# Patient Record
Sex: Female | Born: 2000 | Race: Black or African American | Hispanic: No | Marital: Single | Smoking: Never smoker
Health system: Southern US, Community
[De-identification: ages and names within clinical notes are randomized; demographics above are authoritative.]

## PROBLEM LIST (undated history)

## (undated) ENCOUNTER — Ambulatory Visit: Admission: EM | Payer: Self-pay

## (undated) DIAGNOSIS — J45909 Unspecified asthma, uncomplicated: Secondary | ICD-10-CM

---
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
## 2020-06-03 IMAGING — CT CT PELVIS W/O CM
2 of 3 series · 15 of 46 positions shown, 17 images · non-contrast
Comparison: Left hip x-rays from same day. CT abdomen pelvis from
yesterday.

CLINICAL DATA: Left acetabular fracture status post ORIF.

EXAM:
CT PELVIS WITHOUT CONTRAST
TECHNIQUE: Multidetector CT imaging of the pelvis was performed following the
standard protocol without intravenous contrast.

[Series 5: pelvis 2.0 (person_name) (person_name) · axial · 0.82mm/px · z∈[+810,+1032]mm · 12 of 129 slices shown, 14 images]
[im 9/129  soft-tissue]
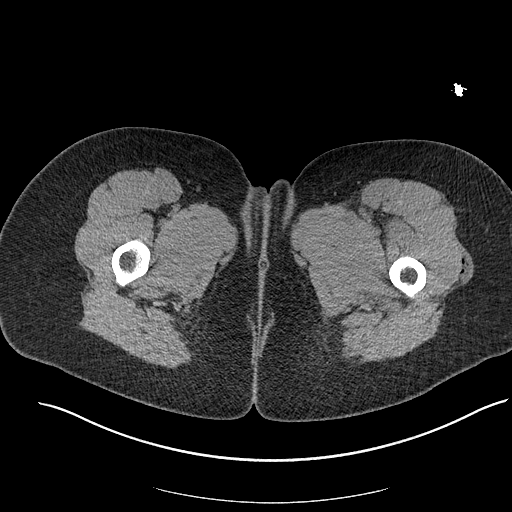
[im 9/129  bone]
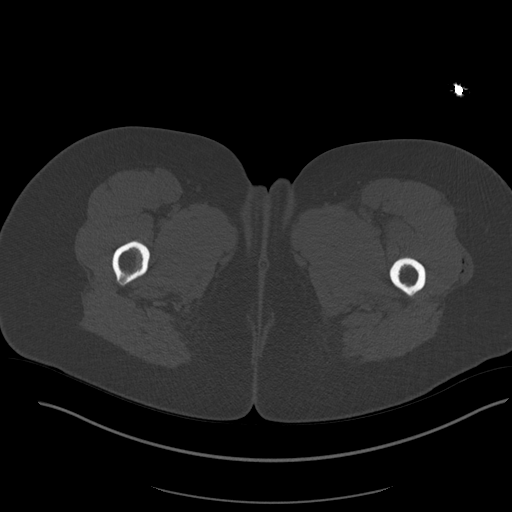
[im 17/129  soft-tissue]
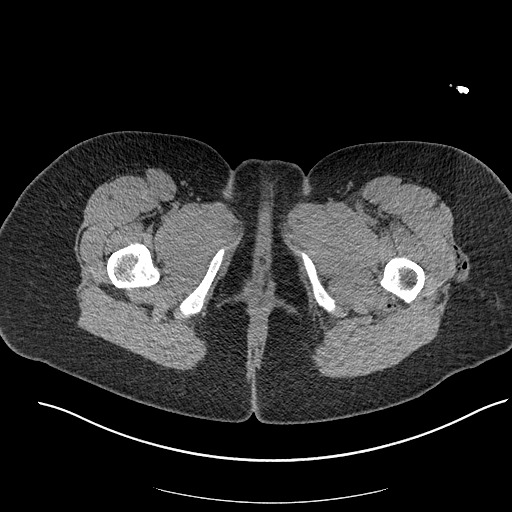
[im 29/129  soft-tissue]
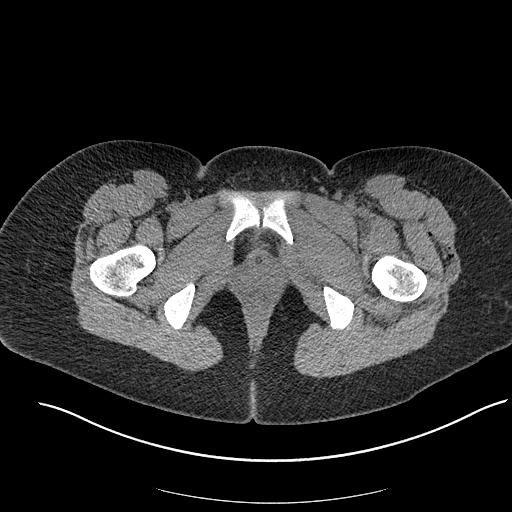
[im 38/129  soft-tissue]
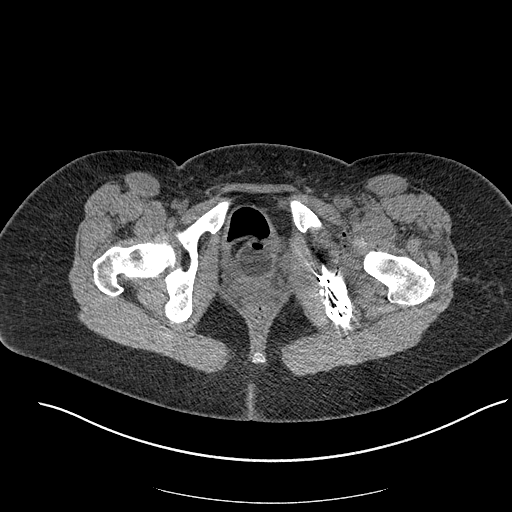
[im 50/129  soft-tissue]
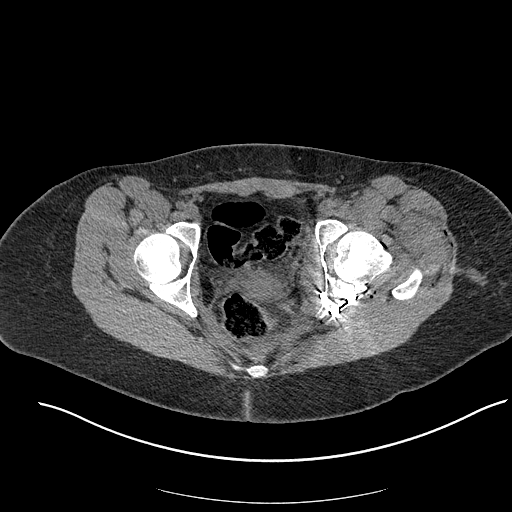
[im 58/129  soft-tissue]
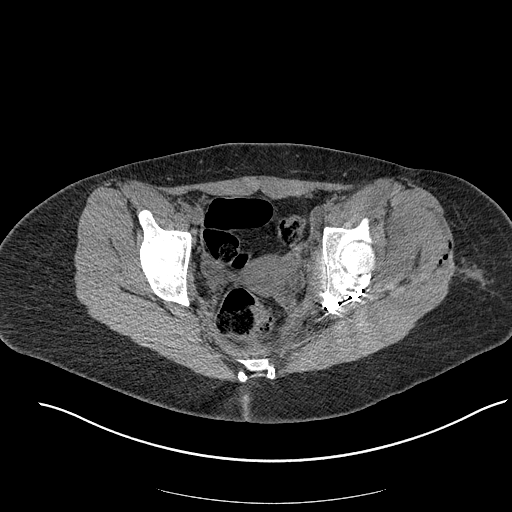
[im 71/129  soft-tissue]
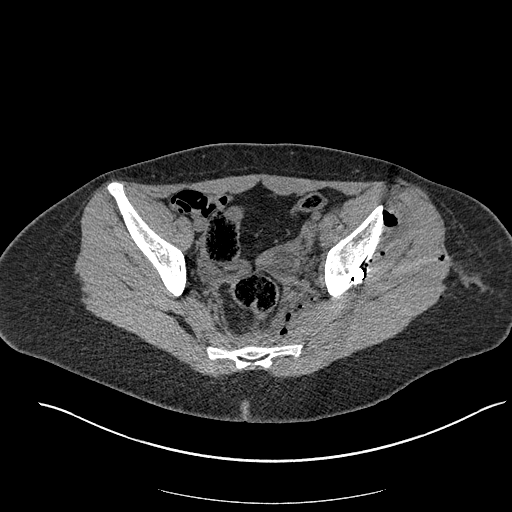
[im 79/129  soft-tissue]
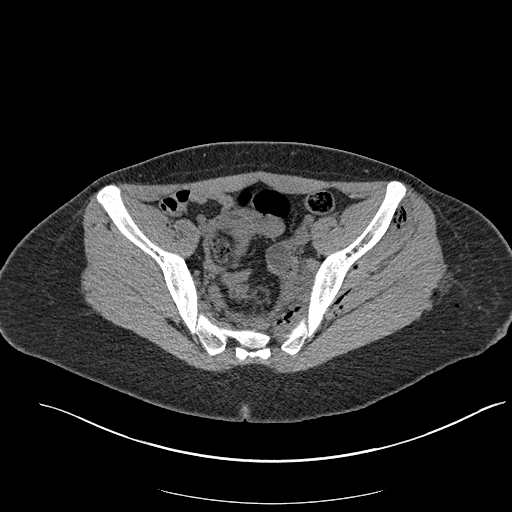
[im 91/129  soft-tissue]
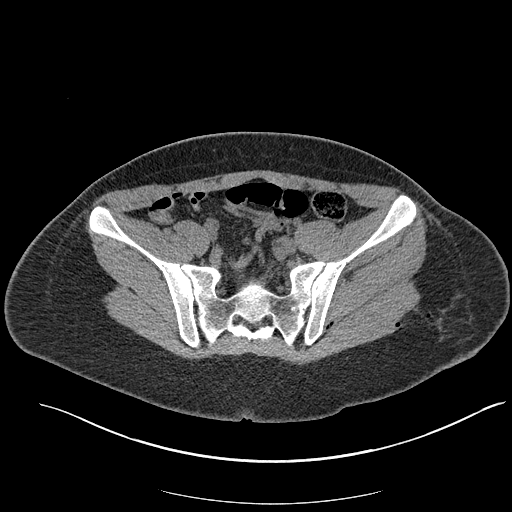
[im 91/129  bone]
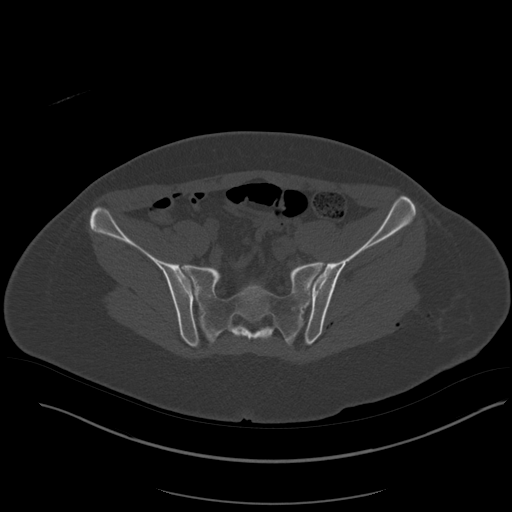
[im 100/129  soft-tissue]
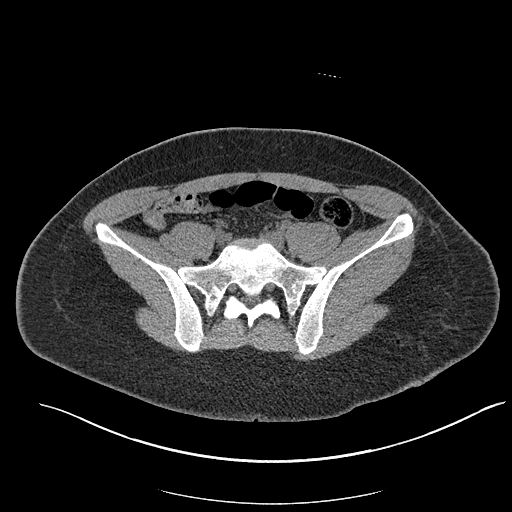
[im 112/129  soft-tissue]
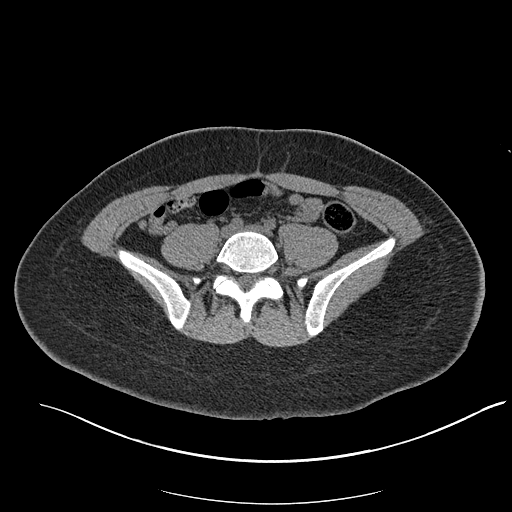
[im 120/129  soft-tissue]
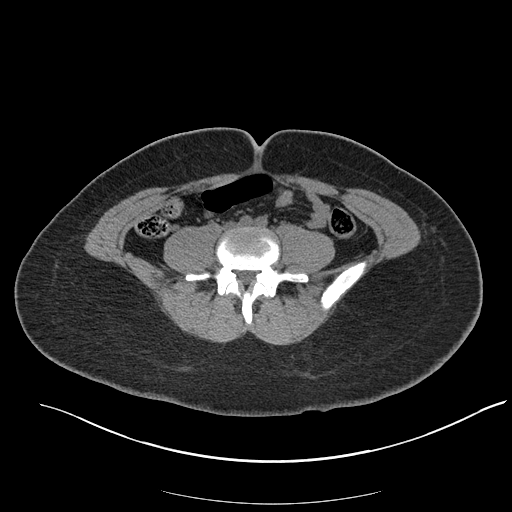

[Series 6: pelvis 2.0 cor. bone · coronal · 0.52mm/px · 3 of 133 slices shown]
[im 45/133  soft-tissue]
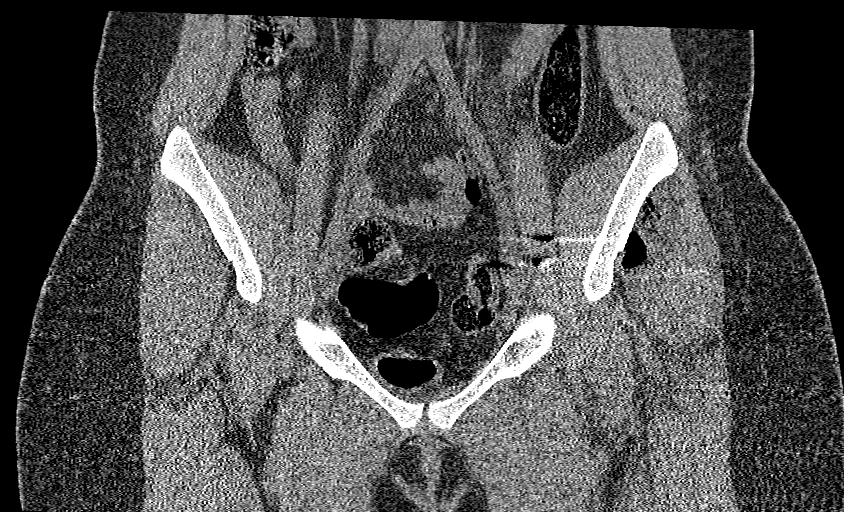
[im 59/133  soft-tissue]
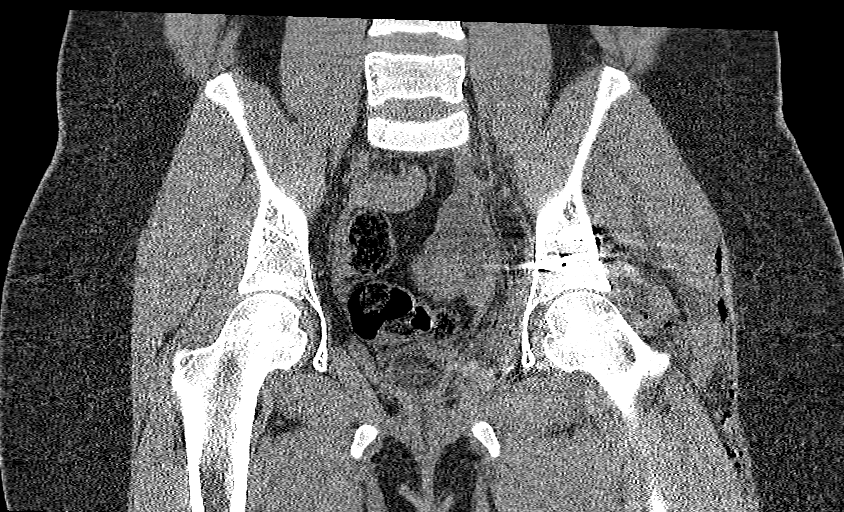
[im 74/133  soft-tissue]
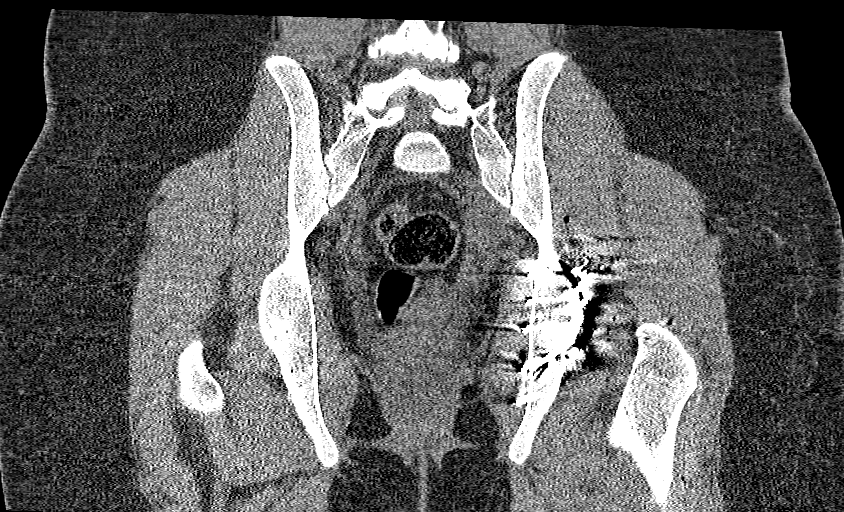

[15 of 46 positions shown; findings below may reference images not displayed]

FINDINGS: Urinary Tract: Foley catheter and air within the decompressed
bladder.

Bowel:  Unremarkable visualized pelvic bowel loops.

Vascular/Lymphatic: No pathologically enlarged lymph nodes. No
significant vascular abnormality seen.

Reproductive: Uterus and bilateral adnexa are unremarkable. Dominant
follicle in the left ovary.

Other:  Small amount of left pelvic sidewall hematoma, unchanged.

Musculoskeletal: Postsurgical changes related to interval left
posterior acetabular fracture ORIF. Alignment is now anatomic. No
significant articular surface incongruity. Nondisplaced longitudinal
fracture through the anterior acetabular column is unchanged.
Scattered foci of postsurgical subcutaneous emphysema about the left
hip. Small intra-articular air within the left hip joint. No focal
fluid collection or hematoma.
IMPRESSION: 1. Interval left posterior acetabular fracture ORIF, now in anatomic
alignment. No significant articular surface incongruity.
2. Unchanged nondisplaced longitudinal fracture through the anterior
acetabular column.
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
## 2021-02-08 ENCOUNTER — Other Ambulatory Visit: Payer: Self-pay

## 2021-02-08 DIAGNOSIS — S61210A Laceration without foreign body of right index finger without damage to nail, initial encounter: Secondary | ICD-10-CM | POA: Insufficient documentation

## 2021-02-08 DIAGNOSIS — Z23 Encounter for immunization: Secondary | ICD-10-CM | POA: Diagnosis not present

## 2021-02-08 DIAGNOSIS — S6991XA Unspecified injury of right wrist, hand and finger(s), initial encounter: Secondary | ICD-10-CM | POA: Diagnosis present

## 2021-02-08 DIAGNOSIS — J45909 Unspecified asthma, uncomplicated: Secondary | ICD-10-CM | POA: Diagnosis not present

## 2021-02-08 DIAGNOSIS — W268XXA Contact with other sharp object(s), not elsewhere classified, initial encounter: Secondary | ICD-10-CM | POA: Diagnosis not present
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
## 2021-02-09 ENCOUNTER — Emergency Department
Admission: EM | Admit: 2021-02-09 | Discharge: 2021-02-09 | Disposition: A | Discharge status: Home / Self Care | Payer: Medicaid Other | Attending: Emergency Medicine | Admitting: Emergency Medicine

## 2021-02-09 DIAGNOSIS — S61210A Laceration without foreign body of right index finger without damage to nail, initial encounter: Secondary | ICD-10-CM

## 2021-02-09 MED ORDER — TETANUS-DIPHTH-ACELL PERTUSSIS 5-2.5-18.5 LF-MCG/0.5 IM SUSY
0.5000 mL | PREFILLED_SYRINGE | Freq: Once | INTRAMUSCULAR | Status: AC
  Administered 2021-02-09: 0.5 mL via INTRAMUSCULAR
  Filled 2021-02-09: qty 0.5

## 2021-02-09 MED ORDER — IBUPROFEN 400 MG PO TABS
600.0000 mg | ORAL_TABLET | Freq: Once | ORAL | Status: AC
Start: 2021-02-09 — End: 2021-02-09
  Administered 2021-02-09: 600 mg via ORAL
  Filled 2021-02-09: qty 2

## 2021-02-09 MED ORDER — LIDOCAINE HCL (PF) 1 % IJ SOLN
5.0000 mL | Freq: Once | INTRAMUSCULAR | Status: DC
  Filled 2021-02-09: qty 5

## 2021-02-09 NOTE — Discharge Instructions (Addendum)
Keep laceration dry and clean. Wash with warm water and soap. Apply topical bacitracin. You received 4 stitches that must be removed in 7 days.  Watch for signs of infection: pus, redness of the skin surrounding it, or fever. If these develop see your doctor or return to the ER for antibiotics.

## 2021-02-09 NOTE — ED Provider Notes (Signed)
[facility] Emergency Department Provider Note  ____________________________________________  Time seen: Approximately 1:45 AM  I have reviewed the triage vital signs and the nursing notes.   HISTORY  Chief Complaint Laceration   HPI Katherine Butler is a 20 y.o. female presents for evaluation of finger laceration.  Patient reports that she cut her finger just prior to arrival on the lid of aluminum can.  Unknown last tetanus shot.  She is complaining of moderate constant throbbing pain since it happened.  No other injuries.  Past Medical History:  Diagnosis Date   Asthma     Patient Active Problem List   Diagnosis Date Noted   Acetabular fracture (HCC) 10/27/2017   Closed left acetabular fracture (HCC) 10/27/2017   Hematuria 10/27/2017    Past Surgical History:  Procedure Laterality Date   ORIF ACETABULAR FRACTURE Left 10/28/2017   Procedure: OPEN REDUCTION INTERNAL FIXATION (ORIF) ACETABULAR FRACTURE;  Surgeon: Roby Lofts, MD;  [facility];  Service: Orthopedics;  Laterality: Left;    Prior to Admission medications   Medication Sig Start Date End Date Taking? Authorizing Provider  enoxaparin (LOVENOX) 40 MG/0.4ML injection Inject 0.4 mLs (40 mg total) into the skin daily. 10/31/17 11/30/17  Swaffar, Otto Herb, MD  medroxyPROGESTERone (DEPO-PROVERA) 150 MG/ML injection ADM 1 ML IM 1 TIME Q 3 MONTHS 08/09/17   [provider]  oxyCODONE 10 MG TABS Take 1 tablet (10 mg total) by mouth every 4 (four) hours as needed for up to 6 doses for breakthrough pain. Patient not taking: Reported on 12/24/2017 10/30/17   Swaffar, Otto Herb, MD  polyethylene glycol (MIRALAX / GLYCOLAX) packet Take 17 g by mouth daily. Patient not taking: Reported on 12/24/2017 10/31/17   Marca Ancona, MD  senna (SENOKOT) 8.6 MG TABS tablet Take 1 tablet (8.6 mg total) by mouth daily. Patient not taking: Reported on 12/24/2017 10/31/17   Marca Ancona, MD     Allergies Patient has no known allergies.  No family history on file.  Social History Social History   Tobacco Use   Smoking status: Never   Smokeless tobacco: Never  Substance Use Topics   Alcohol use: No    Review of Systems  Constitutional: Negative for fever. Eyes: Negative for visual changes. ENT: Negative for sore throat. Neck: No neck pain  Cardiovascular: Negative for chest pain. Respiratory: Negative for shortness of breath. Gastrointestinal: Negative for abdominal pain, vomiting or diarrhea. Genitourinary: Negative for dysuria. Musculoskeletal: Negative for back pain. Skin: Negative for rash. + laceration Neurological: Negative for headaches, weakness or numbness. Psych: No SI or HI  ____________________________________________   PHYSICAL EXAM:  VITAL SIGNS: ED Triage Vitals  Enc Vitals Group     BP 02/08/21 2303 133/88     Pulse Rate 02/08/21 2303 92     Resp 02/08/21 2303 16     Temp 02/08/21 2303 98.9 F (37.2 C)     Temp Source 02/08/21 2303 Oral     SpO2 02/08/21 2303 100 %     Weight 02/08/21 2302 180 lb (81.6 kg)     Height 02/08/21 2302 5\' 4"  (1.626 m)     Head Circumference --      Peak Flow --      Pain Score 02/08/21 2302 10     Pain Loc --      Pain Edu? --      Excl. in GC? --     Constitutional: Alert and oriented. Well appearing and in  no apparent distress. HEENT:      Head: Normocephalic and atraumatic.         Eyes: Conjunctivae are normal. Sclera is non-icteric.       Mouth/Throat: Mucous membranes are moist.       Neck: Supple with no signs of meningismus. Cardiovascular: Regular rate and rhythm.  Respiratory: Normal respiratory effort.  Musculoskeletal:  There is a 2 cm laceration in the palmar aspect of the right index finger. No tendon involvement and no foreign body Neurologic: Normal speech and language. Face is symmetric. Moving all extremities. No gross focal neurologic deficits are appreciated. Skin: Skin is  warm, dry and intact. No rash noted. Psychiatric: Mood and affect are normal. Speech and behavior are normal.  ____________________________________________   LABS (all labs ordered are listed, but only abnormal results are displayed)  Labs Reviewed - No data to display ____________________________________________  EKG  none  ____________________________________________  RADIOLOGY  none  ____________________________________________   PROCEDURES  Procedure(s) performed:yes  .Marland KitchenLaceration Repair  Date/Time: 02/09/2021 4:05 AM Performed by: Nita Sickle, MD Authorized by: Nita Sickle, MD   Consent:    Consent obtained:  Verbal   Consent given by:  Patient   Risks discussed:  Infection, pain, retained foreign body, tendon damage, vascular damage, poor wound healing, poor cosmetic result, need for additional repair and nerve damage   Alternatives discussed:  No treatment Anesthesia:    Anesthesia method:  Nerve block   Block location:  Digital block   Block needle gauge:  25 G   Block anesthetic:  Lidocaine 1% w/o epi   Block injection procedure:  Anatomic landmarks identified, incremental injection and negative aspiration for blood   Block outcome:  Anesthesia achieved Laceration details:    Location:  Finger   Finger location:  R index finger   Length (cm):  2 Pre-procedure details:    Preparation:  Patient was prepped and draped in usual sterile fashion Exploration:    Hemostasis achieved with:  Direct pressure   Imaging outcome: foreign body not noted     Wound exploration: entire depth of wound visualized     Wound extent: no foreign bodies/material noted, no nerve damage noted, no tendon damage noted, no underlying fracture noted and no vascular damage noted     Contaminated: no   Treatment:    Area cleansed with:  Chlorhexidine and saline Skin repair:    Repair method:  Sutures   Suture size:  6-0   Suture material:  Prolene   Suture technique:   Simple interrupted   Number of sutures:  4 Approximation:    Approximation:  Close Repair type:    Repair type:  Simple Post-procedure details:    Dressing:  Sterile dressing   Procedure completion:  Tolerated well, no immediate complications   Critical Care performed:  None ____________________________________________   INITIAL IMPRESSION / ASSESSMENT AND PLAN / ED COURSE   20 y.o. female presents for evaluation of finger laceration.  Laceration repaired per procedure note above.  Discussed wound care with patient.  Tetanus booster given.  Discussed signs and symptoms of infection and recommended return to the emergency room if these develop.  Otherwise follow-up with primary care doctor      _____________________________________________ Please note:  Patient was evaluated in Emergency Department today for the symptoms described in the history of present illness. Patient was evaluated in the context of the global COVID-19 pandemic, which necessitated consideration that the patient might be at risk for infection with  the SARS-CoV-2 virus that causes COVID-19. Institutional protocols and algorithms that pertain to the evaluation of patients at risk for COVID-19 are in a state of rapid change based on information released by regulatory bodies including the CDC and federal and state organizations. These policies and algorithms were followed during the patient's care in the ED.  Some ED evaluations and interventions may be delayed as a result of limited staffing during the pandemic.   [place] Controlled Substance Database was reviewed by me. ____________________________________________   FINAL CLINICAL IMPRESSION(S) / ED DIAGNOSES   Final diagnoses:  Laceration of right index finger without foreign body without damage to nail, initial encounter      NEW MEDICATIONS STARTED DURING THIS VISIT:  ED Discharge Orders     None        Note:  This document was prepared using Dragon  voice recognition software and may include unintentional dictation errors.    Don Perking, [place], MD 02/09/21 [phone]

## 2021-02-19 ENCOUNTER — Ambulatory Visit
Admission: EM | Admit: 2021-02-19 | Discharge: 2021-02-19 | Disposition: A | Discharge status: Home / Self Care | Payer: Medicaid Other

## 2021-02-19 ENCOUNTER — Other Ambulatory Visit: Payer: Self-pay

## 2021-02-19 DIAGNOSIS — Z4802 Encounter for removal of sutures: Secondary | ICD-10-CM | POA: Diagnosis not present

## 2021-02-19 NOTE — ED Triage Notes (Signed)
Pt here for suture removal. Wound is healing well. Clean, dry and intact.

## 2021-03-05 ENCOUNTER — Ambulatory Visit
Admission: EM | Admit: 2021-03-05 | Discharge: 2021-03-05 | Disposition: A | Discharge status: Home / Self Care | Payer: Medicaid Other | Attending: Emergency Medicine | Admitting: Emergency Medicine

## 2021-03-05 ENCOUNTER — Other Ambulatory Visit: Payer: Self-pay

## 2021-03-05 DIAGNOSIS — H6693 Otitis media, unspecified, bilateral: Secondary | ICD-10-CM

## 2021-03-05 MED ORDER — AMOXICILLIN 875 MG PO TABS: 875.0000 mg | ORAL_TABLET | Freq: Two times a day (BID) | ORAL | 0 refills | Status: AC

## 2021-03-05 NOTE — ED Triage Notes (Signed)
Pt said pressure in her right ear x 1 week. Says it feels full and cant hear.

## 2021-03-05 NOTE — ED Provider Notes (Signed)
Katherine Butler    CSN: [id number] Arrival date & time: 03/05/21  0905      History   Chief Complaint Chief Complaint  Patient presents with   Ear Fullness    HPI Katherine Butler is a 20 y.o. female.  Patient presents with bilateral ear pain x1 week.  She states her ears feel full and have decreased hearing.  No ear drainage.  Treatment attempted at home with OTC eardrops.  She denies fever, chills, rash, sore throat, cough, shortness of breath, or other symptoms.  Her medical history includes asthma.  The history is provided by the patient and medical records.   Past Medical History:  Diagnosis Date   Asthma     Patient Active Problem List   Diagnosis Date Noted   Acetabular fracture (HCC) 10/27/2017   Closed left acetabular fracture (HCC) 10/27/2017   Hematuria 10/27/2017    Past Surgical History:  Procedure Laterality Date   ORIF ACETABULAR FRACTURE Left 10/28/2017   Procedure: OPEN REDUCTION INTERNAL FIXATION (ORIF) ACETABULAR FRACTURE;  Surgeon: Roby Lofts, MD;  Location: [facility] OR;  Service: Orthopedics;  Laterality: Left;    OB History   No obstetric history on file.      Home Medications    Prior to Admission medications   Medication Sig Start Date End Date Taking? Authorizing Provider  amoxicillin (AMOXIL) 875 MG tablet Take 1 tablet (875 mg total) by mouth 2 (two) times daily for 10 days. 03/05/21 03/15/21 Yes Mickie Bail, NP  enoxaparin (LOVENOX) 40 MG/0.4ML injection Inject 0.4 mLs (40 mg total) into the skin daily. 10/31/17 11/30/17  Swaffar, Otto Herb, MD  medroxyPROGESTERone (DEPO-PROVERA) 150 MG/ML injection ADM 1 ML IM 1 TIME Q 3 MONTHS 08/09/17   [provider]  oxyCODONE 10 MG TABS Take 1 tablet (10 mg total) by mouth every 4 (four) hours as needed for up to 6 doses for breakthrough pain. Patient not taking: Reported on 12/24/2017 10/30/17   Swaffar, Otto Herb, MD  polyethylene glycol (MIRALAX / GLYCOLAX) packet Take 17 g by mouth  daily. Patient not taking: Reported on 12/24/2017 10/31/17   Marca Ancona, MD  senna (SENOKOT) 8.6 MG TABS tablet Take 1 tablet (8.6 mg total) by mouth daily. Patient not taking: Reported on 12/24/2017 10/31/17   Marca Ancona, MD    Family History No family history on file.  Social History Social History   Tobacco Use   Smoking status: Never   Smokeless tobacco: Never  Substance Use Topics   Alcohol use: No     Allergies   Patient has no known allergies.   Review of Systems Review of Systems  Constitutional:  Negative for chills and fever.  HENT:  Positive for ear pain and hearing loss. Negative for ear discharge and sore throat.   Respiratory:  Negative for cough and shortness of breath.   Cardiovascular:  Negative for chest pain and palpitations.  Gastrointestinal:  Negative for abdominal pain and vomiting.  Skin:  Negative for color change and rash.  All other systems reviewed and are negative.   Physical Exam Triage Vital Signs ED Triage Vitals  Enc Vitals Group     BP      Pulse      Resp      Temp      Temp src      SpO2      Weight      Height      Head Circumference  Peak Flow      Pain Score      Pain Loc      Pain Edu?      Excl. in GC?    No data found.  Updated Vital Signs BP 125/81   Pulse 82   Temp 98.7 F (37.1 C)   Resp 18   SpO2 96%   Visual Acuity Right Eye Distance:   Left Eye Distance:   Bilateral Distance:    Right Eye Near:   Left Eye Near:    Bilateral Near:     Physical Exam Vitals and nursing note reviewed.  Constitutional:      General: She is not in acute distress.    Appearance: She is well-developed. She is not ill-appearing.  HENT:     Head: Normocephalic and atraumatic.     Right Ear: Ear canal normal. Tympanic membrane is erythematous.     Left Ear: Ear canal normal. Tympanic membrane is erythematous.     Nose: Nose normal.     Mouth/Throat:     Mouth: Mucous membranes are moist.      Pharynx: Oropharynx is clear.  Eyes:     Conjunctiva/sclera: Conjunctivae normal.  Cardiovascular:     Rate and Rhythm: Normal rate and regular rhythm.     Heart sounds: Normal heart sounds.  Pulmonary:     Effort: Pulmonary effort is normal. No respiratory distress.     Breath sounds: Normal breath sounds.  Abdominal:     Palpations: Abdomen is soft.     Tenderness: There is no abdominal tenderness.  Musculoskeletal:     Cervical back: Neck supple.  Skin:    General: Skin is warm and dry.  Neurological:     General: No focal deficit present.     Mental Status: She is alert and oriented to person, place, and time.     Gait: Gait normal.  Psychiatric:        Mood and Affect: Mood normal.        Behavior: Behavior normal.     UC Treatments / Results  Labs (all labs ordered are listed, but only abnormal results are displayed) Labs Reviewed - No data to display  EKG   Radiology No results found.  Procedures Procedures (including critical care time)  Medications Ordered in UC Medications - No data to display  Initial Impression / Assessment and Plan / UC Course  I have reviewed the triage vital signs and the nursing notes.  Pertinent labs & imaging results that were available during my care of the patient were reviewed by me and considered in my medical decision making (see chart for details).   Bilateral otitis media.  Treating with amoxicillin.  Instructed patient to take Tylenol or ibuprofen as needed for discomfort.  Instructed her to follow-up with her PCP if her symptoms are not improving.  She agrees to plan of care.   Final Clinical Impressions(s) / UC Diagnoses   Final diagnoses:  Bilateral otitis media, unspecified otitis media type     Discharge Instructions      Take the amoxicillin as directed.  Take ibuprofen as needed for discomfort.  Follow up with your primary care provider if your symptoms are not improving.         ED Prescriptions      Medication Sig Dispense Auth. Provider   amoxicillin (AMOXIL) 875 MG tablet Take 1 tablet (875 mg total) by mouth 2 (two) times daily for 10 days. 20 tablet Arlana Pouch,  Fredrich Romans, NP      PDMP not reviewed this encounter.   Mickie Bail, NP 03/05/21 [phone]

## 2021-03-05 NOTE — Discharge Instructions (Addendum)
Take the amoxicillin as directed.  Take ibuprofen as needed for discomfort.  Follow up with your primary care provider if your symptoms are not improving.

## 2022-04-21 ENCOUNTER — Ambulatory Visit
Admission: EM | Admit: 2022-04-21 | Discharge: 2022-04-21 | Disposition: A | Discharge status: Home / Self Care | Payer: Medicaid Other | Attending: Urgent Care | Admitting: Urgent Care

## 2022-04-21 DIAGNOSIS — N949 Unspecified condition associated with female genital organs and menstrual cycle: Secondary | ICD-10-CM

## 2022-04-21 NOTE — ED Provider Notes (Addendum)
[name]    CSN: [id number] Arrival date & time: 04/21/22  1722      History   Chief Complaint Chief Complaint  Patient presents with   Nausea   vaginal discomfort    HPI Katherine Butler is a 21 y.o. female.   HPI  Presents to urgent care with complaint of nausea and "discomfort" in the groin  She states the nausea occurs when she goes to the gym having not eaten any food.  She is recently trying to eat healthier having previously eaten a lot of fast foods.  She states she has some discomfort in the area of her groin though is nonspecific about the feeling that she has.  She endorses vaginal discharge at baseline.  Denies fever, vomiting.  Denies myalgias or chills.  Past Medical History:  Diagnosis Date   Asthma     Patient Active Problem List   Diagnosis Date Noted   Acetabular fracture (HCC) 10/27/2017   Closed left acetabular fracture (HCC) 10/27/2017   Hematuria 10/27/2017    Past Surgical History:  Procedure Laterality Date   ORIF ACETABULAR FRACTURE Left 10/28/2017   Procedure: OPEN REDUCTION INTERNAL FIXATION (ORIF) ACETABULAR FRACTURE;  Surgeon: Roby Lofts, MD;  Location: [facility];  Service: Orthopedics;  Laterality: Left;    OB History   No obstetric history on file.      Home Medications    Prior to Admission medications   Medication Sig Start Date End Date Taking? Authorizing Provider  enoxaparin (LOVENOX) 40 MG/0.4ML injection Inject 0.4 mLs (40 mg total) into the skin daily. 10/31/17 11/30/17  Swaffar, Otto Herb, MD  medroxyPROGESTERone (DEPO-PROVERA) 150 MG/ML injection ADM 1 ML IM 1 TIME Q 3 MONTHS 08/09/17   [provider]  oxyCODONE 10 MG TABS Take 1 tablet (10 mg total) by mouth every 4 (four) hours as needed for up to 6 doses for breakthrough pain. Patient not taking: Reported on 12/24/2017 10/30/17   Swaffar, Otto Herb, MD  polyethylene glycol (MIRALAX / GLYCOLAX) packet Take 17 g by mouth daily. Patient not taking:  Reported on 12/24/2017 10/31/17   Marca Ancona, MD  senna (SENOKOT) 8.6 MG TABS tablet Take 1 tablet (8.6 mg total) by mouth daily. Patient not taking: Reported on 12/24/2017 10/31/17   Marca Ancona, MD    Family History History reviewed. No pertinent family history.  Social History Social History   Tobacco Use   Smoking status: Never   Smokeless tobacco: Never  Substance Use Topics   Alcohol use: No     Allergies   Patient has no known allergies.   Review of Systems Review of Systems   Physical Exam Triage Vital Signs ED Triage Vitals  Enc Vitals Group     BP 04/21/22 1808 118/84     Pulse Rate 04/21/22 1808 97     Resp 04/21/22 1808 18     Temp 04/21/22 1808 97.7 F (36.5 C)     Temp src --      SpO2 04/21/22 1808 99 %     Weight --      Height --      Head Circumference --      Peak Flow --      Pain Score 04/21/22 1809 0     Pain Loc --      Pain Edu? --      Excl. in GC? --    No data found.  Updated Vital Signs BP 118/84  Pulse 97   Temp 97.7 F (36.5 C)   Resp 18   LMP  (LMP Unknown)   SpO2 99%   Visual Acuity Right Eye Distance:   Left Eye Distance:   Bilateral Distance:    Right Eye Near:   Left Eye Near:    Bilateral Near:     Physical Exam Vitals reviewed.  Constitutional:      Appearance: Normal appearance.  Skin:    General: Skin is warm and dry.  Neurological:     General: No focal deficit present.     Mental Status: She is alert and oriented to person, place, and time.  Psychiatric:        Mood and Affect: Mood normal.        Behavior: Behavior normal.      UC Treatments / Results  Labs (all labs ordered are listed, but only abnormal results are displayed) Labs Reviewed  CERVICOVAGINAL ANCILLARY ONLY    EKG   Radiology No results found.  Procedures Procedures (including critical care time)  Medications Ordered in UC Medications - No data to display  Initial Impression / Assessment and Plan  / UC Course  I have reviewed the triage vital signs and the nursing notes.  Pertinent labs & imaging results that were available during my care of the patient were reviewed by me and considered in my medical decision making (see chart for details).   Cervical swab was obtained today and results will be communicated to the patient when available.  She states she is not concerned about her symptoms of nausea.  I recommended eating some carbohydrates with protein before working out.   Final Clinical Impressions(s) / UC Diagnoses   Final diagnoses:  Vaginal discomfort   Discharge Instructions   None    ED Prescriptions   None    PDMP not reviewed this encounter.   Rose Phi, [place] 04/21/22 1825    Rose Phi, [place] 04/21/22 1825

## 2022-04-21 NOTE — ED Triage Notes (Signed)
Pt. Presents to UC w/ c/o nausea and a discomfort feeling in her groin area. Pt. Request STD testing but did not state any recent STD exposure.

## 2022-04-21 NOTE — Discharge Instructions (Addendum)
The results of today's testing will be communicated to you when they are available.    Follow up here or with your primary care provider if your symptoms are worsening or not improving.

## 2022-04-22 LAB — CERVICOVAGINAL ANCILLARY ONLY
Bacterial Vaginitis (gardnerella): POSITIVE — AB
Candida Glabrata: NEGATIVE
Candida Vaginitis: NEGATIVE
Chlamydia: NEGATIVE
Comment: NEGATIVE
Comment: NEGATIVE
Comment: NEGATIVE
Comment: NEGATIVE
Comment: NEGATIVE
Comment: NORMAL
Neisseria Gonorrhea: NEGATIVE
Trichomonas: NEGATIVE

## 2022-04-23 ENCOUNTER — Telehealth (HOSPITAL_COMMUNITY): Payer: Self-pay | Admitting: Emergency Medicine

## 2022-04-23 MED ORDER — METRONIDAZOLE 500 MG PO TABS: 500.0000 mg | ORAL_TABLET | Freq: Two times a day (BID) | ORAL | 0 refills | Status: DC

## 2022-05-05 ENCOUNTER — Ambulatory Visit
Admission: EM | Admit: 2022-05-05 | Discharge: 2022-05-05 | Disposition: A | Discharge status: Home / Self Care | Payer: Medicaid Other | Attending: Urgent Care | Admitting: Urgent Care

## 2022-05-05 DIAGNOSIS — J358 Other chronic diseases of tonsils and adenoids: Secondary | ICD-10-CM

## 2022-05-05 DIAGNOSIS — J029 Acute pharyngitis, unspecified: Secondary | ICD-10-CM

## 2022-05-05 LAB — POCT RAPID STREP A (OFFICE): Rapid Strep A Screen: NEGATIVE

## 2022-05-05 MED ORDER — AMOXICILLIN 500 MG PO CAPS: 500.0000 mg | ORAL_CAPSULE | Freq: Two times a day (BID) | ORAL | 0 refills | Status: AC

## 2022-05-05 NOTE — ED Triage Notes (Signed)
Pt. Presents to UC w/c/o a sore throat and cough for the past two days Pt. Also endorses itching all over.

## 2022-05-05 NOTE — Discharge Instructions (Signed)
Follow up with your primary care or ENT provider if your symptoms are worsening or not improving with treatment.    

## 2022-05-05 NOTE — ED Provider Notes (Signed)
[name]    CSN: [id number] Arrival date & time: 05/05/22  1127      History   Chief Complaint Chief Complaint  Patient presents with  . Sore Throat  . Cough  . Pruritis    HPI Katherine Butler is a 21 y.o. female.    Sore Throat  Cough   Presents to urgent care with complaint of sore throat and cough x 2 days.  She also endorses pruritus.  Presents with elevated heart rate of 115 bpm.  Past Medical History:  Diagnosis Date  . Asthma     Patient Active Problem List   Diagnosis Date Noted  . Acetabular fracture (HCC) 10/27/2017  . Closed left acetabular fracture (HCC) 10/27/2017  . Hematuria 10/27/2017    Past Surgical History:  Procedure Laterality Date  . ORIF ACETABULAR FRACTURE Left 10/28/2017   Procedure: OPEN REDUCTION INTERNAL FIXATION (ORIF) ACETABULAR FRACTURE;  Surgeon: Roby Lofts, MD;  Location: [facility];  Service: Orthopedics;  Laterality: Left;    OB History   No obstetric history on file.      Home Medications    Prior to Admission medications   Medication Sig Start Date End Date Taking? Authorizing Provider  enoxaparin (LOVENOX) 40 MG/0.4ML injection Inject 0.4 mLs (40 mg total) into the skin daily. 10/31/17 11/30/17  Swaffar, Otto Herb, MD  medroxyPROGESTERone (DEPO-PROVERA) 150 MG/ML injection ADM 1 ML IM 1 TIME Q 3 MONTHS 08/09/17   [provider]  metroNIDAZOLE (FLAGYL) 500 MG tablet Take 1 tablet (500 mg total) by mouth 2 (two) times daily. 04/23/22   Lamptey, Britta Mccreedy, MD  oxyCODONE 10 MG TABS Take 1 tablet (10 mg total) by mouth every 4 (four) hours as needed for up to 6 doses for breakthrough pain. Patient not taking: Reported on 12/24/2017 10/30/17   Swaffar, Otto Herb, MD  polyethylene glycol (MIRALAX / GLYCOLAX) packet Take 17 g by mouth daily. Patient not taking: Reported on 12/24/2017 10/31/17   Marca Ancona, MD  senna (SENOKOT) 8.6 MG TABS tablet Take 1 tablet (8.6 mg total) by mouth daily. Patient not  taking: Reported on 12/24/2017 10/31/17   Marca Ancona, MD    Family History History reviewed. No pertinent family history.  Social History Social History   Tobacco Use  . Smoking status: Never  . Smokeless tobacco: Never  Substance Use Topics  . Alcohol use: No     Allergies   Patient has no known allergies.   Review of Systems Review of Systems  Respiratory:  Positive for cough.      Physical Exam Triage Vital Signs ED Triage Vitals  Enc Vitals Group     BP 05/05/22 1224 102/80     Pulse Rate 05/05/22 1224 (!) 115     Resp 05/05/22 1224 18     Temp 05/05/22 1224 98.4 F (36.9 C)     Temp src --      SpO2 05/05/22 1224 98 %     Weight --      Height --      Head Circumference --      Peak Flow --      Pain Score 05/05/22 1225 0     Pain Loc --      Pain Edu? --      Excl. in GC? --    No data found.  Updated Vital Signs BP 102/80   Pulse (!) 115   Temp 98.4 F (36.9 C)   Resp  18   LMP  (LMP Unknown)   SpO2 98%   Visual Acuity Right Eye Distance:   Left Eye Distance:   Bilateral Distance:    Right Eye Near:   Left Eye Near:    Bilateral Near:     Physical Exam Vitals reviewed.  HENT:     Mouth/Throat:     Tonsils: 2+ on the right. 2+ on the left.      Comments: Large tonsillolith is present at L tonsil. Skin:    General: Skin is warm and dry.  Neurological:     General: No focal deficit present.     Mental Status: She is alert and oriented to person, place, and time.  Psychiatric:        Mood and Affect: Mood normal.        Behavior: Behavior normal.     UC Treatments / Results  Labs (all labs ordered are listed, but only abnormal results are displayed) Labs Reviewed - No data to display  EKG   Radiology No results found.  Procedures Procedures (including critical care time)  Medications Ordered in UC Medications - No data to display  Initial Impression / Assessment and Plan / UC Course  I have reviewed the  triage vital signs and the nursing notes.  Pertinent labs & imaging results that were available during my care of the patient were reviewed by me and considered in my medical decision making (see chart for details).   Patient is afebrile here without recent antipyretics. Satting well on room air. Overall is ill appearing, well hydrated, without respiratory distress. Pulmonary exam is unremarkable.   There is a large structure, white-yellow in color, attached to the full height of the left tonsil.  This structure is not removable with swab.  Rapid strep is negative.  Presumed viral etiology for her symptoms though will treat for possible strep with amoxicillin while recommending follow-up with ENT for evaluation of possible large tonsillolith.  Final Clinical Impressions(s) / UC Diagnoses   Final diagnoses:  None   Discharge Instructions   None    ED Prescriptions   None    PDMP not reviewed this encounter.   Charma Igo, [place] 05/05/22 1240

## 2022-06-30 ENCOUNTER — Other Ambulatory Visit: Payer: Self-pay | Admitting: Nurse Practitioner

## 2022-06-30 DIAGNOSIS — Z793 Long term (current) use of hormonal contraceptives: Secondary | ICD-10-CM

## 2022-08-04 ENCOUNTER — Ambulatory Visit
Admission: EM | Admit: 2022-08-04 | Discharge: 2022-08-04 | Disposition: A | Discharge status: Home / Self Care | Payer: Medicaid Other | Attending: Emergency Medicine | Admitting: Emergency Medicine

## 2022-08-04 DIAGNOSIS — Z113 Encounter for screening for infections with a predominantly sexual mode of transmission: Secondary | ICD-10-CM

## 2022-08-04 DIAGNOSIS — N898 Other specified noninflammatory disorders of vagina: Secondary | ICD-10-CM

## 2022-08-04 LAB — POCT URINE PREGNANCY: Preg Test, Ur: NEGATIVE

## 2022-08-04 NOTE — Discharge Instructions (Signed)
Your tests are pending.  If your test results are positive, we will call you.  You and your sexual partner(s) may require treatment at that time.  Do not have sexual activity for at least 7 days.    Follow up with your primary care provider if your symptoms are not improving.    

## 2022-08-04 NOTE — ED Provider Notes (Signed)
Roderic Palau    CSN: [id number] Arrival date & time: 08/04/22  1509      History   Chief Complaint Chief Complaint  Patient presents with   SEXUALLY TRANSMITTED DISEASE    HPI Katherine Butler is a 22 y.o. female.  Patient presents with 1 week history of vaginal itching.  She denies vaginal discharge, pelvic pain, rash, lesions, abdominal pain, dysuria, or other symptoms.  No treatment at home.  Patient requests STD testing.    The history is provided by the patient and medical records.    Past Medical History:  Diagnosis Date   Asthma     Patient Active Problem List   Diagnosis Date Noted   Acetabular fracture ([place]) 10/27/2017   Closed left acetabular fracture ([place]) 10/27/2017   Hematuria 10/27/2017    Past Surgical History:  Procedure Laterality Date   ORIF ACETABULAR FRACTURE Left 10/28/2017   Procedure: OPEN REDUCTION INTERNAL FIXATION (ORIF) ACETABULAR FRACTURE;  Surgeon: Shona Needles, MD;  Location: [place];  Service: Orthopedics;  Laterality: Left;    OB History   No obstetric history on file.      Home Medications    Prior to Admission medications   Medication Sig Start Date End Date Taking? Authorizing Provider  enoxaparin (LOVENOX) 40 MG/0.4ML injection Inject 0.4 mLs (40 mg total) into the skin daily. Patient not taking: Reported on 08/04/2022 10/31/17 11/30/17  Swaffar, Windell Norfolk, MD  medroxyPROGESTERone (DEPO-PROVERA) 150 MG/ML injection ADM 1 ML IM 1 TIME Q 3 MONTHS 08/09/17   [provider]  metroNIDAZOLE (FLAGYL) 500 MG tablet Take 1 tablet (500 mg total) by mouth 2 (two) times daily. Patient not taking: Reported on 08/04/2022 04/23/22   Chase Picket, MD  oxyCODONE 10 MG TABS Take 1 tablet (10 mg total) by mouth every 4 (four) hours as needed for up to 6 doses for breakthrough pain. Patient not taking: Reported on 12/24/2017 10/30/17   Swaffar, Windell Norfolk, MD  polyethylene glycol (MIRALAX / GLYCOLAX) packet Take 17 g by mouth  daily. Patient not taking: Reported on 12/24/2017 10/31/17   Jerolyn Shin, MD  senna (SENOKOT) 8.6 MG TABS tablet Take 1 tablet (8.6 mg total) by mouth daily. Patient not taking: Reported on 12/24/2017 10/31/17   Jerolyn Shin, MD    Family History History reviewed. No pertinent family history.  Social History Social History   Tobacco Use   Smoking status: Never   Smokeless tobacco: Never  Substance Use Topics   Alcohol use: No     Allergies   Patient has no known allergies.   Review of Systems Review of Systems  Constitutional:  Negative for chills and fever.  Gastrointestinal:  Negative for abdominal pain, constipation, diarrhea, nausea and vomiting.  Genitourinary:  Negative for dysuria, flank pain, hematuria, pelvic pain and vaginal discharge.  Skin:  Negative for rash and wound.  All other systems reviewed and are negative.    Physical Exam Triage Vital Signs ED Triage Vitals  Enc Vitals Group     BP      Pulse      Resp      Temp      Temp src      SpO2      Weight      Height      Head Circumference      Peak Flow      Pain Score      Pain Loc      Pain  Edu?      Excl. in [place]?    No data found.  Updated Vital Signs BP 102/72   Pulse 92   Temp 98.1 F (36.7 C)   Resp 18   Ht 5\' 4"  (1.626 m)   Wt 180 lb (81.6 kg)   SpO2 98%   BMI 30.90 kg/m   Visual Acuity Right Eye Distance:   Left Eye Distance:   Bilateral Distance:    Right Eye Near:   Left Eye Near:    Bilateral Near:     Physical Exam Vitals and nursing note reviewed.  Constitutional:      General: She is not in acute distress.    Appearance: She is well-developed. She is not ill-appearing.  HENT:     Mouth/Throat:     Mouth: Mucous membranes are moist.  Cardiovascular:     Rate and Rhythm: Normal rate and regular rhythm.     Heart sounds: Normal heart sounds.  Pulmonary:     Effort: Pulmonary effort is normal. No respiratory distress.     Breath sounds: Normal  breath sounds.  Abdominal:     General: Bowel sounds are normal.     Palpations: Abdomen is soft.     Tenderness: There is no abdominal tenderness. There is no right CVA tenderness, left CVA tenderness, guarding or rebound.  Musculoskeletal:     Cervical back: Neck supple.  Skin:    General: Skin is warm and dry.  Neurological:     Mental Status: She is alert.  Psychiatric:        Mood and Affect: Mood normal.        Behavior: Behavior normal.      UC Treatments / Results  Labs (all labs ordered are listed, but only abnormal results are displayed) Labs Reviewed  POCT URINE PREGNANCY  CERVICOVAGINAL ANCILLARY ONLY    EKG   Radiology No results found.  Procedures Procedures (including critical care time)  Medications Ordered in UC Medications - No data to display  Initial Impression / Assessment and Plan / UC Course  I have reviewed the triage vital signs and the nursing notes.  Pertinent labs & imaging results that were available during my care of the patient were reviewed by me and considered in my medical decision making (see chart for details).   Vaginal itching, STD screening.  Patient obtained vaginal self swab for testing.  Discussed that we will call if test results are positive.  Discussed that she may require treatment at that time.  Discussed that sexual partner(s) may also require treatment.  Instructed patient to abstain from sexual activity for at least 7 days.  Instructed her to follow-up with her PCP or gynecologist if her symptoms are not improving.  Patient agrees to plan of care.    Final Clinical Impressions(s) / UC Diagnoses   Final diagnoses:  Screening for STD (sexually transmitted disease)  Vaginal itching     Discharge Instructions      Your tests are pending.  If your test results are positive, we will call you.  You and your sexual partner(s) may require treatment at that time.  Do not have sexual activity for at least 7 days.     Follow up with your primary care provider if your symptoms are not improving.         ED Prescriptions   None    PDMP not reviewed this encounter.   Sharion Balloon, NP 08/04/22 [phone]

## 2022-08-04 NOTE — ED Triage Notes (Signed)
Patient to Urgent Care with complaints of vaginal itching x1 week. Denies any abnormal vaginal discharge or urinary symptoms.  Requests STD swab.

## 2022-08-05 LAB — CERVICOVAGINAL ANCILLARY ONLY
Bacterial Vaginitis (gardnerella): NEGATIVE
Candida Glabrata: NEGATIVE
Candida Vaginitis: POSITIVE — AB
Chlamydia: NEGATIVE
Comment: NEGATIVE
Comment: NEGATIVE
Comment: NEGATIVE
Comment: NEGATIVE
Comment: NEGATIVE
Comment: NORMAL
Neisseria Gonorrhea: NEGATIVE
Trichomonas: NEGATIVE

## 2022-08-06 ENCOUNTER — Telehealth (HOSPITAL_COMMUNITY): Payer: Self-pay | Admitting: Emergency Medicine

## 2022-08-06 MED ORDER — FLUCONAZOLE 150 MG PO TABS: 150.0000 mg | ORAL_TABLET | Freq: Once | ORAL | 0 refills | Status: AC

## 2022-09-10 ENCOUNTER — Ambulatory Visit
Admission: EM | Admit: 2022-09-10 | Discharge: 2022-09-10 | Disposition: A | Discharge status: Home / Self Care | Payer: Medicaid Other | Attending: Emergency Medicine | Admitting: Emergency Medicine

## 2022-09-10 DIAGNOSIS — Z3202 Encounter for pregnancy test, result negative: Secondary | ICD-10-CM

## 2022-09-10 DIAGNOSIS — G8929 Other chronic pain: Secondary | ICD-10-CM

## 2022-09-10 DIAGNOSIS — Z113 Encounter for screening for infections with a predominantly sexual mode of transmission: Secondary | ICD-10-CM

## 2022-09-10 DIAGNOSIS — N898 Other specified noninflammatory disorders of vagina: Secondary | ICD-10-CM | POA: Diagnosis not present

## 2022-09-10 DIAGNOSIS — R0981 Nasal congestion: Secondary | ICD-10-CM | POA: Diagnosis not present

## 2022-09-10 DIAGNOSIS — J069 Acute upper respiratory infection, unspecified: Secondary | ICD-10-CM

## 2022-09-10 DIAGNOSIS — Z1152 Encounter for screening for COVID-19: Secondary | ICD-10-CM | POA: Diagnosis not present

## 2022-09-10 DIAGNOSIS — M545 Low back pain, unspecified: Secondary | ICD-10-CM

## 2022-09-10 LAB — POCT URINALYSIS DIP (MANUAL ENTRY)
Bilirubin, UA: NEGATIVE
Glucose, UA: NEGATIVE mg/dL
Ketones, POC UA: NEGATIVE mg/dL
Leukocytes, UA: NEGATIVE
Nitrite, UA: NEGATIVE
Protein Ur, POC: NEGATIVE mg/dL
Spec Grav, UA: 1.01 (ref 1.010–1.025)
Urobilinogen, UA: 0.2 E.U./dL
pH, UA: 6.5 (ref 5.0–8.0)

## 2022-09-10 LAB — POCT URINE PREGNANCY: Preg Test, Ur: NEGATIVE

## 2022-09-10 NOTE — ED Provider Notes (Signed)
[facility] Katherine Butler    CSN: [id number] Arrival date & time: 09/10/22  1644      History   Chief Complaint Chief Complaint  Patient presents with   Nasal Congestion   SEXUALLY TRANSMITTED DISEASE    HPI Katherine Butler is a 22 y.o. female.  Patient presents with 1 day history of chills, nasal congestion, cough.  She had a sore throat yesterday but has resolved.  Patient also presents with vaginal odor x 1 week.  Patient also presents with chronic low back pain x several months.  Treating congestion with Dayquil, Nyquil, cough drops.  She denies fever, sshortness of breath, abdominal pain, vomiting, diarrhea, numbness, weakness, or other symptoms.  Her medical history includes candidal vaginitis on 08/04/2022 and bacterial vaginitis on 04/21/2022.  The history is provided by the patient and medical records.    Past Medical History:  Diagnosis Date   Asthma     Patient Active Problem List   Diagnosis Date Noted   Acetabular fracture 10/27/2017   Closed left acetabular fracture 10/27/2017   Hematuria 10/27/2017    Past Surgical History:  Procedure Laterality Date   ORIF ACETABULAR FRACTURE Left 10/28/2017   Procedure: OPEN REDUCTION INTERNAL FIXATION (ORIF) ACETABULAR FRACTURE;  Surgeon: Roby Lofts, MD;  Location: [facility];  Service: Orthopedics;  Laterality: Left;    OB History   No obstetric history on file.      Home Medications    Prior to Admission medications   Medication Sig Start Date End Date Taking? Authorizing Provider  enoxaparin (LOVENOX) 40 MG/0.4ML injection Inject 0.4 mLs (40 mg total) into the skin daily. Patient not taking: Reported on 08/04/2022 10/31/17 11/30/17  Swaffar, Otto Herb, MD  medroxyPROGESTERone (DEPO-PROVERA) 150 MG/ML injection ADM 1 ML IM 1 TIME Q 3 MONTHS 08/09/17   [provider]  metroNIDAZOLE (FLAGYL) 500 MG tablet Take 1 tablet (500 mg total) by mouth 2 (two) times daily. Patient not taking: Reported on 08/04/2022 04/23/22    Merrilee Jansky, MD  oxyCODONE 10 MG TABS Take 1 tablet (10 mg total) by mouth every 4 (four) hours as needed for up to 6 doses for breakthrough pain. Patient not taking: Reported on 12/24/2017 10/30/17   Swaffar, Otto Herb, MD  polyethylene glycol (MIRALAX / GLYCOLAX) packet Take 17 g by mouth daily. Patient not taking: Reported on 12/24/2017 10/31/17   Marca Ancona, MD  senna (SENOKOT) 8.6 MG TABS tablet Take 1 tablet (8.6 mg total) by mouth daily. Patient not taking: Reported on 12/24/2017 10/31/17   Marca Ancona, MD    Family History No family history on file.  Social History Social History   Tobacco Use   Smoking status: Never   Smokeless tobacco: Never  Substance Use Topics   Alcohol use: No     Allergies   Patient has no known allergies.   Review of Systems Review of Systems  Constitutional:  Positive for chills. Negative for fever.  HENT:  Positive for congestion. Negative for ear pain and sore throat.   Respiratory:  Positive for cough. Negative for shortness of breath.   Cardiovascular:  Negative for chest pain and palpitations.  Gastrointestinal:  Negative for abdominal pain, diarrhea, nausea and vomiting.  Genitourinary:  Negative for dysuria, flank pain, frequency, hematuria, pelvic pain and vaginal discharge.  Musculoskeletal:  Positive for back pain. Negative for arthralgias, gait problem and joint swelling.  Skin:  Negative for rash.  Neurological:  Negative for weakness and numbness.  Physical Exam Triage Vital Signs ED Triage Vitals  Enc Vitals Group     BP 09/10/22 1656 117/72     Pulse Rate 09/10/22 1656 86     Resp 09/10/22 1656 18     Temp 09/10/22 1656 97.9 F (36.6 C)     Temp src --      SpO2 09/10/22 1656 97 %     Weight --      Height --      Head Circumference --      Peak Flow --      Pain Score 09/10/22 1655 0     Pain Loc --      Pain Edu? --      Excl. in [place]? --    No data found.  Updated Vital Signs BP 117/72    Pulse 86   Temp 97.9 F (36.6 C)   Resp 18   LMP  (Approximate)   SpO2 97%   Visual Acuity Right Eye Distance:   Left Eye Distance:   Bilateral Distance:    Right Eye Near:   Left Eye Near:    Bilateral Near:     Physical Exam Vitals and nursing note reviewed.  Constitutional:      General: She is not in acute distress.    Appearance: Normal appearance. She is well-developed. She is not ill-appearing.  HENT:     Right Ear: Tympanic membrane normal.     Left Ear: Tympanic membrane normal.     Nose: Rhinorrhea present.     Mouth/Throat:     Mouth: Mucous membranes are moist.     Pharynx: Oropharynx is clear.  Cardiovascular:     Rate and Rhythm: Normal rate and regular rhythm.     Heart sounds: Normal heart sounds.  Pulmonary:     Effort: Pulmonary effort is normal. No respiratory distress.     Breath sounds: Normal breath sounds.  Abdominal:     General: Bowel sounds are normal.     Palpations: Abdomen is soft.     Tenderness: There is no abdominal tenderness. There is no right CVA tenderness, left CVA tenderness, guarding or rebound.  Musculoskeletal:     Cervical back: Neck supple.  Skin:    General: Skin is warm and dry.  Neurological:     Mental Status: She is alert.  Psychiatric:        Mood and Affect: Mood normal.        Behavior: Behavior normal.      UC Treatments / Results  Labs (all labs ordered are listed, but only abnormal results are displayed) Labs Reviewed  POCT URINALYSIS DIP (MANUAL ENTRY) - Abnormal; Notable for the following components:      Result Value   Blood, UA trace-intact (*)    All other components within normal limits  SARS CORONAVIRUS 2 (TAT 6-24 HRS)  POCT URINE PREGNANCY  CERVICOVAGINAL ANCILLARY ONLY    EKG   Radiology No results found.  Procedures Procedures (including critical care time)  Medications Ordered in UC Medications - No data to display  Initial Impression / Assessment and Plan / UC Course  I  have reviewed the triage vital signs and the nursing notes.  Pertinent labs & imaging results that were available during my care of the patient were reviewed by me and considered in my medical decision making (see chart for details).   STD screening, vaginal odor, nasal congestion, viral URI, chronic low back pain, negative pregnancy test.  Urine pregnancy negative, urine does not show signs of infection.  COVID pending.  If COVID positive, recommend symptomatic treatment.  Discussed symptomatic treatment for URI symptoms.  Patient obtained vaginal self swab for testing.  Discussed that we will call if test results are positive.  Discussed that she may require treatment at that time.  Discussed that sexual partner(s) may also require treatment.  Instructed patient to abstain from sexual activity for at least 7 days.  Instructed her to follow-up with her PCP if her symptoms are not improving.  Patient agrees to plan of care.    Final Clinical Impressions(s) / UC Diagnoses   Final diagnoses:  Screening for STD (sexually transmitted disease)  Vaginal odor  Nasal congestion  Viral URI  Chronic bilateral low back pain without sciatica  Negative pregnancy test     Discharge Instructions      Your urine pregnancy is negative and your urine does not show infection.   Your COVID test is pending.    Your vaginal tests are pending.  If your test results are positive, we will call you.  You and your sexual partner(s) may require treatment at that time.  Do not have sexual activity for at least 7 days.    Follow up with your primary care provider if your symptoms are not improving.          ED Prescriptions   None    PDMP not reviewed this encounter.   Mickie Bail, NP 09/10/22 1739

## 2022-09-10 NOTE — ED Triage Notes (Addendum)
Patient to Urgent Care with complaints of nasal congestion/ chills (unknown fever)/ cough. Symptoms started yesterday.  Also reports some vaginal odor- reports having some pressure when she is standing around. Denies any discharge or urinary symptoms. Symptoms started last week. Requests STD testing.   Also reports some lower back pain.

## 2022-09-10 NOTE — Discharge Instructions (Addendum)
Your urine pregnancy is negative and your urine does not show infection.   Your COVID test is pending.    Your vaginal tests are pending.  If your test results are positive, we will call you.  You and your sexual partner(s) may require treatment at that time.  Do not have sexual activity for at least 7 days.    Follow up with your primary care provider if your symptoms are not improving.

## 2022-09-11 LAB — CERVICOVAGINAL ANCILLARY ONLY
Bacterial Vaginitis (gardnerella): NEGATIVE
Candida Glabrata: NEGATIVE
Candida Vaginitis: NEGATIVE
Chlamydia: NEGATIVE
Comment: NEGATIVE
Comment: NEGATIVE
Comment: NEGATIVE
Comment: NEGATIVE
Comment: NEGATIVE
Comment: NORMAL
Neisseria Gonorrhea: NEGATIVE
Trichomonas: NEGATIVE

## 2022-09-11 LAB — SARS CORONAVIRUS 2 (TAT 6-24 HRS): SARS Coronavirus 2: NEGATIVE

## 2022-11-04 ENCOUNTER — Ambulatory Visit
Admission: EM | Admit: 2022-11-04 | Discharge: 2022-11-04 | Disposition: A | Discharge status: Home / Self Care | Payer: Medicaid Other | Attending: Urgent Care | Admitting: Urgent Care

## 2022-11-04 ENCOUNTER — Encounter: Payer: Self-pay | Admitting: Emergency Medicine

## 2022-11-04 DIAGNOSIS — Z113 Encounter for screening for infections with a predominantly sexual mode of transmission: Secondary | ICD-10-CM

## 2022-11-04 DIAGNOSIS — R300 Dysuria: Secondary | ICD-10-CM

## 2022-11-04 NOTE — ED Provider Notes (Signed)
Katherine Butler    CSN: [id number] Arrival date & time: 11/04/22  0803      History   Chief Complaint Chief Complaint  Patient presents with   Vaginitis    HPI Katherine Butler is a 22 y.o. female.   HPI  Presents to UC with c/o "internal" vaginal pain starting yesterday.  Denies vaginal burning, itching or discharge. Denies trauma. Denies bowel/bladder changes. Denies or fever.  Endorses PMH including BV with similar symptoms.  Denies new sexual partners. Requesting STD screening.  LMP unknown. Depo-Provera with last injection 2 months ago.  Past Medical History:  Diagnosis Date   Asthma     Patient Active Problem List   Diagnosis Date Noted   Acetabular fracture (HCC) 10/27/2017   Closed left acetabular fracture (HCC) 10/27/2017   Hematuria 10/27/2017    Past Surgical History:  Procedure Laterality Date   ORIF ACETABULAR FRACTURE Left 10/28/2017   Procedure: OPEN REDUCTION INTERNAL FIXATION (ORIF) ACETABULAR FRACTURE;  Surgeon: Roby Lofts, MD;  Location: [facility] OR;  Service: Orthopedics;  Laterality: Left;    OB History   No obstetric history on file.      Home Medications    Prior to Admission medications   Medication Sig Start Date End Date Taking? Authorizing Provider  medroxyPROGESTERone (DEPO-PROVERA) 150 MG/ML injection ADM 1 ML IM 1 TIME Q 3 MONTHS 08/09/17  Yes [provider]  enoxaparin (LOVENOX) 40 MG/0.4ML injection Inject 0.4 mLs (40 mg total) into the skin daily. Patient not taking: Reported on 08/04/2022 10/31/17 11/30/17  Swaffar, Otto Herb, MD  metroNIDAZOLE (FLAGYL) 500 MG tablet Take 1 tablet (500 mg total) by mouth 2 (two) times daily. Patient not taking: Reported on 08/04/2022 04/23/22   Merrilee Jansky, MD  oxyCODONE 10 MG TABS Take 1 tablet (10 mg total) by mouth every 4 (four) hours as needed for up to 6 doses for breakthrough pain. Patient not taking: Reported on 12/24/2017 10/30/17   Swaffar, Otto Herb, MD  polyethylene  glycol (MIRALAX / GLYCOLAX) packet Take 17 g by mouth daily. Patient not taking: Reported on 12/24/2017 10/31/17   Marca Ancona, MD  senna (SENOKOT) 8.6 MG TABS tablet Take 1 tablet (8.6 mg total) by mouth daily. Patient not taking: Reported on 12/24/2017 10/31/17   Marca Ancona, MD    Family History History reviewed. No pertinent family history.  Social History Social History   Tobacco Use   Smoking status: Never   Smokeless tobacco: Never  Substance Use Topics   Alcohol use: No     Allergies   Patient has no known allergies.   Review of Systems Review of Systems   Physical Exam Triage Vital Signs ED Triage Vitals [11/04/22 0816]  Enc Vitals Group     BP 113/75     Pulse Rate (!) 101     Resp 16     Temp 98.8 F (37.1 C)     Temp Source Oral     SpO2 98 %     Weight      Height      Head Circumference      Peak Flow      Pain Score 7     Pain Loc      Pain Edu?      Excl. in GC?    No data found.  Updated Vital Signs BP 113/75 (BP Location: Left Arm)   Pulse (!) 101   Temp 98.8 F (37.1 C) (Oral)  Resp 16   SpO2 98%   Visual Acuity Right Eye Distance:   Left Eye Distance:   Bilateral Distance:    Right Eye Near:   Left Eye Near:    Bilateral Near:     Physical Exam Vitals reviewed.  Constitutional:      Appearance: Normal appearance.  Skin:    General: Skin is warm.  Neurological:     General: No focal deficit present.     Mental Status: She is alert and oriented to person, place, and time.  Psychiatric:        Mood and Affect: Mood normal.        Behavior: Behavior normal.      UC Treatments / Results  Labs (all labs ordered are listed, but only abnormal results are displayed) Labs Reviewed  RPR  HIV ANTIBODY (ROUTINE TESTING W REFLEX)  POCT URINALYSIS DIP (MANUAL ENTRY)  CERVICOVAGINAL ANCILLARY ONLY    EKG   Radiology No results found.  Procedures Procedures (including critical care time)  Medications  Ordered in UC Medications - No data to display  Initial Impression / Assessment and Plan / UC Course  I have reviewed the triage vital signs and the nursing notes.  Pertinent labs & imaging results that were available during my care of the patient were reviewed by me and considered in my medical decision making (see chart for details).   Vaginal self-swab performed by patient and result pending. Blood labs also drawn.   Final Clinical Impressions(s) / UC Diagnoses   Final diagnoses:  Screen for STD (sexually transmitted disease)  Dysuria   Discharge Instructions   None    ED Prescriptions   None    PDMP not reviewed this encounter.   Charma Igo, [place] 11/04/22 [phone]

## 2022-11-04 NOTE — Discharge Instructions (Signed)
Follow up here or with your primary care provider if your symptoms are worsening or not improving.     

## 2022-11-04 NOTE — ED Triage Notes (Signed)
Reports new spontaneous internal vaginal pain starting yesterday. Denies burning, itching, new discharge, trauma to area, denies any changes to bowel or bladder habits, dysuria, fever. Reports only similar occurrence as having had BV in the past. Denies new sexual partners, but requesting swab and blood work for [place]. Is on depo, last injection within the last two months, unsure of when LMP was due to birth control.

## 2022-11-05 LAB — CERVICOVAGINAL ANCILLARY ONLY
Bacterial Vaginitis (gardnerella): POSITIVE — AB
Candida Glabrata: NEGATIVE
Candida Vaginitis: NEGATIVE
Chlamydia: NEGATIVE
Comment: NEGATIVE
Comment: NEGATIVE
Comment: NEGATIVE
Comment: NEGATIVE
Comment: NEGATIVE
Comment: NORMAL
Neisseria Gonorrhea: NEGATIVE
Trichomonas: NEGATIVE

## 2022-11-05 LAB — HIV ANTIBODY (ROUTINE TESTING W REFLEX): HIV Screen 4th Generation wRfx: NONREACTIVE

## 2022-11-05 LAB — RPR: RPR Ser Ql: NONREACTIVE

## 2022-11-06 ENCOUNTER — Telehealth (HOSPITAL_COMMUNITY): Payer: Self-pay | Admitting: Emergency Medicine

## 2022-11-06 MED ORDER — METRONIDAZOLE 500 MG PO TABS: 500.0000 mg | ORAL_TABLET | Freq: Two times a day (BID) | ORAL | 0 refills | Status: AC

## 2022-11-28 ENCOUNTER — Ambulatory Visit
Admission: RE | Admit: 2022-11-28 | Discharge: 2022-11-28 | Disposition: A | Discharge status: Home / Self Care | Payer: Medicaid Other | Source: Ambulatory Visit | Attending: Nurse Practitioner | Admitting: Nurse Practitioner

## 2022-11-28 DIAGNOSIS — Z793 Long term (current) use of hormonal contraceptives: Secondary | ICD-10-CM

## 2022-12-11 ENCOUNTER — Ambulatory Visit
Admission: EM | Admit: 2022-12-11 | Discharge: 2022-12-11 | Disposition: A | Discharge status: Home / Self Care | Payer: Medicaid Other | Attending: Emergency Medicine | Admitting: Emergency Medicine

## 2022-12-11 DIAGNOSIS — R102 Pelvic and perineal pain: Secondary | ICD-10-CM | POA: Diagnosis not present

## 2022-12-11 MED ORDER — MICONAZOLE NITRATE 2 % EX CREA: 1.0000 | TOPICAL_CREAM | Freq: Two times a day (BID) | CUTANEOUS | 0 refills | Status: AC

## 2022-12-11 MED ORDER — FLUCONAZOLE 150 MG PO TABS: 150.0000 mg | ORAL_TABLET | Freq: Once | ORAL | 1 refills | Status: AC

## 2022-12-11 NOTE — Discharge Instructions (Signed)
Take the medication as written. Give [place] a working phone number so that we can contact you if needed. Refrain from sexual contact until all of your labs have come back, symptoms have resolved, and your partner(s) are treated if necessary. Return to the ER if you get worse, have a fever >100.4, or for any concerns.   Go to www.goodrx.com  or www.costplusdrugs.com to look up your medications. This will give you a list of where you can find your prescriptions at the most affordable prices. Or ask the pharmacist what the cash price is, or if they have any other discount programs available to help make your medication more affordable. This can be less expensive than what you would pay with insurance.

## 2022-12-11 NOTE — ED Triage Notes (Signed)
Pt presents with vaginal burning and irritation that started 3 days ago.

## 2022-12-11 NOTE — ED Provider Notes (Addendum)
HPI  SUBJECTIVE:  Katherine Butler is a 22 y.o. female who presents with 2 days of vaginal irritation.  No odor, bleeding, discharge, rash, labial swelling.  No urinary complaints, nausea, vomiting, fevers, abdominal, back, pelvic pain.  She is in a monogamous relationship with a female, who is asymptomatic, but would like to be checked for STDs.  No new soaps or recent antibiotics.  Symptoms improve when she showers.  No aggravating factors.  She has not tried anything for her symptoms.  She has a past medical history of BV and yeast x 1.  No history of diabetes, gonorrhea, chlamydia, HIV, HSV, syphilis or trichomonas.  LMP: Amenorrheic.  She is on Depo shot.  Denies the possibility of being pregnant.  [facility].     Past Medical History:  Diagnosis Date   Asthma     Past Surgical History:  Procedure Laterality Date   ORIF ACETABULAR FRACTURE Left 10/28/2017   Procedure: OPEN REDUCTION INTERNAL FIXATION (ORIF) ACETABULAR FRACTURE;  Surgeon: Roby Lofts, MD;  Location: [facility] OR;  Service: Orthopedics;  Laterality: Left;    History reviewed. No pertinent family history.  Social History   Tobacco Use   Smoking status: Never   Smokeless tobacco: Never  Substance Use Topics   Alcohol use: No   Drug use: Never    No current facility-administered medications for this encounter.  Current Outpatient Medications:    fluconazole (DIFLUCAN) 150 MG tablet, Take 1 tablet (150 mg total) by mouth once for 1 dose. 1 tab po x 1. May repeat in 72 hours if no improvement, Disp: 2 tablet, Rfl: 1   medroxyPROGESTERone (DEPO-PROVERA) 150 MG/ML injection, ADM 1 ML IM 1 TIME Q 3 MONTHS, Disp: , Rfl: 0   miconazole (MICOTIN) 2 % cream, Apply 1 Application topically 2 (two) times daily., Disp: 28.35 g, Rfl: 0   metroNIDAZOLE (FLAGYL) 500 MG tablet, Take 1 tablet (500 mg total) by mouth 2 (two) times daily., Disp: 14 tablet, Rfl: 0  No Known Allergies   ROS  As noted in HPI.   Physical  Exam  BP 110/76 (BP Location: Left Arm)   Pulse 80   Temp 99 F (37.2 C) (Oral)   Resp 18   SpO2 97%   Constitutional: Well developed, well nourished, no acute distress Eyes:  EOMI, conjunctiva normal bilaterally HENT: Normocephalic, atraumatic,mucus membranes moist Respiratory: Normal inspiratory effort Cardiovascular: Normal rate GI: nondistended soft, nontender. No suprapubic, flank, lower quadrant tenderness  back: No CVA tenderness GU: Deferred skin: No rash, skin intact Musculoskeletal: no deformities Neurologic: Alert & oriented x 3, no focal neuro deficits Psychiatric: Speech and behavior appropriate   ED Course   Medications - No data to display  No orders of the defined types were placed in this encounter.   No results found for this or any previous visit (from the past 24 hour(s)). No results found.  ED Clinical Impression  1. Vaginal pain     ED Assessment/Plan    H&P most c/w yeast infection.  Sent a swab for gonorrhea, chlamydia, trichomonas, BV and yeast.  She declined HIV and RPR testing.  Will not treat empirically now.. Will send home with diflucan and miconazole vaginal cream for yeast infection. Advised pt to refrain from sexual contact until she knows lab results, symptoms resolve, and partner(s) are treated if necessary. Pt provided working phone number. Follow-up with PCP as needed. Discussed labs, MDM, plan and followup with patient. Pt agrees with plan.  Meds ordered this encounter  Medications   fluconazole (DIFLUCAN) 150 MG tablet    Sig: Take 1 tablet (150 mg total) by mouth once for 1 dose. 1 tab po x 1. May repeat in 72 hours if no improvement    Dispense:  2 tablet    Refill:  1   miconazole (MICOTIN) 2 % cream    Sig: Apply 1 Application topically 2 (two) times daily.    Dispense:  28.35 g    Refill:  0    *This clinic note was created using Scientist, clinical (histocompatibility and immunogenetics). Therefore, there may be occasional mistakes despite careful  proofreading.  ?     Domenick Gong, MD 12/11/22 1056    Domenick Gong, MD 12/11/22 1056

## 2022-12-15 ENCOUNTER — Telehealth: Payer: Self-pay

## 2022-12-15 MED ORDER — METRONIDAZOLE 500 MG PO TABS: 500.0000 mg | ORAL_TABLET | Freq: Two times a day (BID) | ORAL | 0 refills | Status: AC

## 2022-12-15 NOTE — Telephone Encounter (Signed)
Pt requires tx with metronidazole. Reviewed with patient, verified pharmacy, prescription sent

## 2023-02-16 ENCOUNTER — Ambulatory Visit
Admission: EM | Admit: 2023-02-16 | Discharge: 2023-02-16 | Disposition: A | Discharge status: Home / Self Care | Payer: Medicaid Other | Attending: Emergency Medicine | Admitting: Emergency Medicine

## 2023-02-16 DIAGNOSIS — N898 Other specified noninflammatory disorders of vagina: Secondary | ICD-10-CM | POA: Diagnosis not present

## 2023-02-16 DIAGNOSIS — R300 Dysuria: Secondary | ICD-10-CM | POA: Diagnosis not present

## 2023-02-16 LAB — POCT URINALYSIS DIP (MANUAL ENTRY)
Glucose, UA: NEGATIVE mg/dL
Nitrite, UA: NEGATIVE
Protein Ur, POC: 30 mg/dL — AB
Spec Grav, UA: >=1.03 — AB (ref 1.010–1.025)
Urobilinogen, UA: 1 U/dL
pH, UA: 6 (ref 5.0–8.0)

## 2023-02-16 LAB — POCT URINE PREGNANCY: Preg Test, Ur: NEGATIVE

## 2023-02-16 MED ORDER — CEPHALEXIN 500 MG PO CAPS: 500.0000 mg | ORAL_CAPSULE | Freq: Two times a day (BID) | ORAL | 0 refills | Status: AC

## 2023-02-16 NOTE — Discharge Instructions (Signed)
Take the antibiotic as directed.  The urine culture is pending.  We will call you if it shows the need to change or discontinue your antibiotic.    Your vaginal tests are pending.  If your test results are positive, we will call you.  You and your sexual partner(s) may require treatment at that time.  Do not have sexual activity for at least 7 days.    Follow up with your primary care provider if your symptoms are not improving.      

## 2023-02-16 NOTE — ED Provider Notes (Signed)
[name]    CSN: [id number] Arrival date & time: 02/16/23  0831      History   Chief Complaint Chief Complaint  Patient presents with   Vaginal Discharge    HPI Katherine Butler is a 22 y.o. female.  Patient presents with 3-day history of dysuria, vaginal discharge, vaginal itching.  She denies fever, rash, abdominal pain, hematuria, pelvic pain, or other symptoms.  No treatments at home.  Her medical history includes recurrent bacterial vaginitis and candidal vaginitis.  The history is provided by the patient and medical records.    Past Medical History:  Diagnosis Date   Asthma     Patient Active Problem List   Diagnosis Date Noted   Acetabular fracture (HCC) 10/27/2017   Closed left acetabular fracture (HCC) 10/27/2017   Hematuria 10/27/2017    Past Surgical History:  Procedure Laterality Date   ORIF ACETABULAR FRACTURE Left 10/28/2017   Procedure: OPEN REDUCTION INTERNAL FIXATION (ORIF) ACETABULAR FRACTURE;  Surgeon: Roby Lofts, MD;  Location: [facility] OR;  Service: Orthopedics;  Laterality: Left;    OB History   No obstetric history on file.      Home Medications    Prior to Admission medications   Medication Sig Start Date End Date Taking? Authorizing Provider  cephALEXin (KEFLEX) 500 MG capsule Take 1 capsule (500 mg total) by mouth 2 (two) times daily for 5 days. 02/16/23 02/21/23 Yes Mickie Bail, NP  medroxyPROGESTERone (DEPO-PROVERA) 150 MG/ML injection ADM 1 ML IM 1 TIME Q 3 MONTHS 08/09/17   [provider]  metroNIDAZOLE (FLAGYL) 500 MG tablet Take 1 tablet (500 mg total) by mouth 2 (two) times daily. 11/06/22   Lamptey, Britta Mccreedy, MD  miconazole (MICOTIN) 2 % cream Apply 1 Application topically 2 (two) times daily. 12/11/22   Domenick Gong, MD    Family History History reviewed. No pertinent family history.  Social History Social History   Tobacco Use   Smoking status: Never   Smokeless tobacco: Never  Substance Use  Topics   Alcohol use: No   Drug use: Never     Allergies   Patient has no known allergies.   Review of Systems Review of Systems  Constitutional:  Negative for chills and fever.  Gastrointestinal:  Negative for abdominal pain, diarrhea and vomiting.  Genitourinary:  Positive for dysuria and vaginal discharge. Negative for flank pain, hematuria and pelvic pain.  Skin:  Negative for rash.     Physical Exam Triage Vital Signs ED Triage Vitals  Encounter Vitals Group     BP      Systolic BP Percentile      Diastolic BP Percentile      Pulse      Resp      Temp      Temp src      SpO2      Weight      Height      Head Circumference      Peak Flow      Pain Score      Pain Loc      Pain Education      Exclude from Growth Chart    No data found.  Updated Vital Signs BP 114/78   Pulse (!) 103   Temp 97.7 F (36.5 C)   Resp 18   SpO2 98%   Visual Acuity Right Eye Distance:   Left Eye Distance:   Bilateral Distance:    Right Eye Near:  Left Eye Near:    Bilateral Near:     Physical Exam Vitals and nursing note reviewed.  Constitutional:      General: She is not in acute distress.    Appearance: She is well-developed.  HENT:     Mouth/Throat:     Mouth: Mucous membranes are moist.  Cardiovascular:     Rate and Rhythm: Normal rate and regular rhythm.     Heart sounds: Normal heart sounds.  Pulmonary:     Effort: Pulmonary effort is normal. No respiratory distress.     Breath sounds: Normal breath sounds.  Abdominal:     General: Bowel sounds are normal.     Palpations: Abdomen is soft.     Tenderness: There is no abdominal tenderness. There is no right CVA tenderness, left CVA tenderness, guarding or rebound.  Musculoskeletal:     Cervical back: Neck supple.  Skin:    General: Skin is warm and dry.  Neurological:     Mental Status: She is alert.      UC Treatments / Results  Labs (all labs ordered are listed, but only abnormal results are  displayed) Labs Reviewed  POCT URINALYSIS DIP (MANUAL ENTRY) - Abnormal; Notable for the following components:      Result Value   Clarity, UA cloudy (*)    Bilirubin, UA small (*)    Ketones, POC UA small (15) (*)    Spec Grav, UA >=1.030 (*)    Blood, UA trace-intact (*)    Protein Ur, POC =30 (*)    Leukocytes, UA Large (3+) (*)    All other components within normal limits  URINE CULTURE  POCT URINE PREGNANCY  CERVICOVAGINAL ANCILLARY ONLY    EKG   Radiology No results found.  Procedures Procedures (including critical care time)  Medications Ordered in UC Medications - No data to display  Initial Impression / Assessment and Plan / UC Course  I have reviewed the triage vital signs and the nursing notes.  Pertinent labs & imaging results that were available during my care of the patient were reviewed by me and considered in my medical decision making (see chart for details).    Vaginal discharge, dysuria.  Treating with Keflex. Urine culture pending. Discussed with patient that we will call her if the urine culture shows the need to change or discontinue the antibiotic. Patient obtained vaginal self swab for testing.  Discussed that we will call if test results are positive.  Discussed that she may require additional treatment at that time.  Discussed that sexual partner(s) may also require treatment.  Instructed patient to abstain from sexual activity for at least 7 days.  Instructed her to follow-up with her PCP or gynecologist if her symptoms are not improving.  Patient agrees to plan of care.      Final Clinical Impressions(s) / UC Diagnoses   Final diagnoses:  Vaginal discharge  Dysuria     Discharge Instructions      Take the antibiotic as directed.  The urine culture is pending.  We will call you if it shows the need to change or discontinue your antibiotic.    Your vaginal tests are pending.  If your test results are positive, we will call you.  You and your  sexual partner(s) may require treatment at that time.  Do not have sexual activity for at least 7 days.    Follow-up with your primary care provider if your symptoms are not improving.  ED Prescriptions     Medication Sig Dispense Auth. Provider   cephALEXin (KEFLEX) 500 MG capsule Take 1 capsule (500 mg total) by mouth 2 (two) times daily for 5 days. 10 capsule Mickie Bail, NP      PDMP not reviewed this encounter.   Mickie Bail, NP 02/16/23 [phone]

## 2023-02-16 NOTE — ED Triage Notes (Signed)
Patient to Urgent Care with complaints of vaginal discharge/ burning/ itching.  Symptoms started approx 3-4 days ago.  Denies any urinary symptoms. Denies any concerns for STD's.

## 2023-02-17 LAB — CERVICOVAGINAL ANCILLARY ONLY
Bacterial Vaginitis (gardnerella): NEGATIVE
Candida Glabrata: NEGATIVE
Candida Vaginitis: POSITIVE — AB
Chlamydia: NEGATIVE
Comment: NEGATIVE
Comment: NEGATIVE
Comment: NEGATIVE
Comment: NEGATIVE
Comment: NEGATIVE
Comment: NORMAL
Neisseria Gonorrhea: NEGATIVE
Trichomonas: NEGATIVE

## 2023-02-17 LAB — URINE CULTURE: Culture: 10000 — AB

## 2023-02-18 ENCOUNTER — Telehealth: Payer: Self-pay

## 2023-02-18 MED ORDER — FLUCONAZOLE 150 MG PO TABS: 150.0000 mg | ORAL_TABLET | Freq: Once | ORAL | 0 refills | Status: AC

## 2023-02-18 NOTE — Telephone Encounter (Signed)
 Per protocol, pt requires tx with Diflucan. Reviewed with patient, verified pharmacy, prescription sent.

## 2023-04-07 ENCOUNTER — Ambulatory Visit
Admission: EM | Admit: 2023-04-07 | Discharge: 2023-04-07 | Disposition: A | Discharge status: Home / Self Care | Payer: Medicaid Other | Attending: Emergency Medicine | Admitting: Emergency Medicine

## 2023-04-07 DIAGNOSIS — A084 Viral intestinal infection, unspecified: Secondary | ICD-10-CM

## 2023-04-07 MED ORDER — DICYCLOMINE HCL 20 MG PO TABS: 20.0000 mg | ORAL_TABLET | Freq: Two times a day (BID) | ORAL | 0 refills | Status: DC

## 2023-04-07 NOTE — Discharge Instructions (Signed)
Your symptoms are most likely caused by a virus, it will work its way out your system over the next few days  Exam you have tenderness over the big back muscles called the latissimus dorsi and this is most likely from the physical exertion required to vomit and have diarrhea, area of concern over the stomach is where the stomach lays which is expected with persistent vomiting and diarrhea ideally they should improve with time  Begin dicyclomine twice daily to help reduce spasming of the stomach which ideally will help with abdominal pai   You can use over-the-counter ibuprofen 600 to 800 mg every 6-8 hours or Tylenol 325 to 1000 mg every 6 hours, which ever you have at home, to help manage fevers  Continue to promote hydration throughout the day by using electrolyte replacement solution such as Gatorade, body armor, Pedialyte, which ever you have at home  Try eating bland foods such as bread, rice, toast, fruit which are easier on the stomach to digest, avoid foods that are overly spicy, overly seasoned or greasy

## 2023-04-07 NOTE — ED Provider Notes (Signed)
Katherine Butler    CSN: [id number] Arrival date & time: 04/07/23  1718      History   Chief Complaint Chief Complaint  Patient presents with   Back Pain    HPI Katherine Butler is a 22 y.o. female.   Patient presents for evaluation of centralized abdominal pain and bilateral lower back pain beginning today.  Attempted use of Tylenol which was ineffective.  Experience vomiting and diarrhea over the past 24 to 48 hours due to a stomach bug with known exposure to sick contacts.  All symptoms have resolved and she has been able to resume eating and drinking.  Denies urinary symptoms, fever.  Past Medical History:  Diagnosis Date   Asthma     Patient Active Problem List   Diagnosis Date Noted   Acetabular fracture (HCC) 10/27/2017   Closed left acetabular fracture (HCC) 10/27/2017   Hematuria 10/27/2017    Past Surgical History:  Procedure Laterality Date   ORIF ACETABULAR FRACTURE Left 10/28/2017   Procedure: OPEN REDUCTION INTERNAL FIXATION (ORIF) ACETABULAR FRACTURE;  Surgeon: Roby Lofts, MD;  Location: [facility] OR;  Service: Orthopedics;  Laterality: Left;    OB History   No obstetric history on file.      Home Medications    Prior to Admission medications   Medication Sig Start Date End Date Taking? Authorizing Provider  dicyclomine (BENTYL) 20 MG tablet Take 1 tablet (20 mg total) by mouth 2 (two) times daily. 04/07/23  Yes Damond Borchers, Hansel Starling R, NP  medroxyPROGESTERone (DEPO-PROVERA) 150 MG/ML injection ADM 1 ML IM 1 TIME Q 3 MONTHS 08/09/17   [provider]  metroNIDAZOLE (FLAGYL) 500 MG tablet Take 1 tablet (500 mg total) by mouth 2 (two) times daily. Patient not taking: Reported on 04/07/2023 11/06/22   Merrilee Jansky, MD  miconazole (MICOTIN) 2 % cream Apply 1 Application topically 2 (two) times daily. 12/11/22   Domenick Gong, MD    Family History History reviewed. No pertinent family history.  Social History Social History   Tobacco  Use   Smoking status: Never   Smokeless tobacco: Never  Substance Use Topics   Alcohol use: No   Drug use: Never     Allergies   Patient has no known allergies.   Review of Systems Review of Systems   Physical Exam Triage Vital Signs ED Triage Vitals  Encounter Vitals Group     BP 04/07/23 1736 112/80     Systolic BP Percentile --      Diastolic BP Percentile --      Pulse Rate 04/07/23 1736 91     Resp 04/07/23 1736 16     Temp 04/07/23 1736 98.6 F (37 C)     Temp Source 04/07/23 1736 Oral     SpO2 04/07/23 1736 95 %     Weight --      Height --      Head Circumference --      Peak Flow --      Pain Score 04/07/23 1737 10     Pain Loc --      Pain Education --      Exclude from Growth Chart --    No data found.  Updated Vital Signs BP 112/80 (BP Location: Left Arm)   Pulse 91   Temp 98.6 F (37 C) (Oral)   Resp 16   SpO2 95%   Visual Acuity Right Eye Distance:   Left Eye Distance:   Bilateral Distance:  Right Eye Near:   Left Eye Near:    Bilateral Near:     Physical Exam Constitutional:      Appearance: Normal appearance.  HENT:     Head: Normocephalic.  Eyes:     Extraocular Movements: Extraocular movements intact.  Pulmonary:     Effort: Pulmonary effort is normal.  Abdominal:     General: Abdomen is flat. Bowel sounds are normal.     Palpations: Abdomen is soft.     Tenderness: There is abdominal tenderness in the epigastric area.  Musculoskeletal:     Comments: Tenderness over the lower bilateral latissimus dorsi, no spinal tenderness on exam, able to sit erect without complication, negative straight leg test,  able to twist turn and bend  Neurological:     Mental Status: She is alert and oriented to person, place, and time. Mental status is at baseline.      UC Treatments / Results  Labs (all labs ordered are listed, but only abnormal results are displayed) Labs Reviewed - No data to display  EKG   Radiology No results  found.  Procedures Procedures (including critical care time)  Medications Ordered in UC Medications - No data to display  Initial Impression / Assessment and Plan / UC Course  I have reviewed the triage vital signs and the nursing notes.  Pertinent labs & imaging results that were available during my care of the patient were reviewed by me and considered in my medical decision making (see chart for details).  Viral gastroenteritis  Etiology is most likely residual muscular pain related to persistent vomiting and diarrhea over 48 hours.,  Prescribed dicyclomine and recommended Tylenol and Motrin and heating pad for support, advised against spicy or greasy foods to prevent further stomach irritation and may follow-up as needed if symptoms continue to persist or worsen Final Clinical Impressions(s) / UC Diagnoses   Final diagnoses:  Viral gastroenteritis     Discharge Instructions      Your symptoms are most likely caused by a virus, it will work its way out your system over the next few days  Exam you have tenderness over the big back muscles called the latissimus dorsi and this is most likely from the physical exertion required to vomit and have diarrhea, area of concern over the stomach is where the stomach lays which is expected with persistent vomiting and diarrhea ideally they should improve with time  Begin dicyclomine twice daily to help reduce spasming of the stomach which ideally will help with abdominal pai   You can use over-the-counter ibuprofen 600 to 800 mg every 6-8 hours or Tylenol 325 to 1000 mg every 6 hours, which ever you have at home, to help manage fevers  Continue to promote hydration throughout the day by using electrolyte replacement solution such as Gatorade, body armor, Pedialyte, which ever you have at home  Try eating bland foods such as bread, rice, toast, fruit which are easier on the stomach to digest, avoid foods that are overly spicy, overly  seasoned or greasy    ED Prescriptions     Medication Sig Dispense Auth. Provider   dicyclomine (BENTYL) 20 MG tablet Take 1 tablet (20 mg total) by mouth 2 (two) times daily. 20 tablet Valinda Hoar, NP      PDMP not reviewed this encounter.   Valinda Hoar, NP 04/07/23 [phone]

## 2023-04-07 NOTE — ED Triage Notes (Signed)
Patient to Urgent Care with complaints of stomachache and back pain. Reports her entire back hurts and it radiates into her stomach. Reports NVD yesterday. Denies any known injury.  Reports symptoms started yesterday.

## 2023-05-14 ENCOUNTER — Encounter: Payer: Self-pay | Admitting: Emergency Medicine

## 2023-05-14 ENCOUNTER — Ambulatory Visit
Admission: EM | Admit: 2023-05-14 | Discharge: 2023-05-14 | Disposition: A | Discharge status: Home / Self Care | Payer: Self-pay | Attending: Emergency Medicine | Admitting: Emergency Medicine

## 2023-05-14 ENCOUNTER — Other Ambulatory Visit: Payer: Self-pay

## 2023-05-14 DIAGNOSIS — R197 Diarrhea, unspecified: Secondary | ICD-10-CM

## 2023-05-14 DIAGNOSIS — R1013 Epigastric pain: Secondary | ICD-10-CM

## 2023-05-14 MED ORDER — AZITHROMYCIN 250 MG PO TABS: 250.0000 mg | ORAL_TABLET | Freq: Every day | ORAL | 0 refills | Status: AC

## 2023-05-14 MED ORDER — ONDANSETRON 4 MG PO TBDP: 4.0000 mg | ORAL_TABLET | Freq: Three times a day (TID) | ORAL | 0 refills | Status: AC | PRN

## 2023-05-14 MED ORDER — DICYCLOMINE HCL 20 MG PO TABS: 20.0000 mg | ORAL_TABLET | Freq: Every day | ORAL | 0 refills | Status: AC

## 2023-05-14 NOTE — ED Triage Notes (Signed)
Reports a stomach virus a month ago.  Reports being seen 04/07/2023 with abdominal and back pain.  Patient has diarrhea that has continued as well report.  Patient thinks medicines received at that time did help, but took last pi;; on Sunday.

## 2023-05-14 NOTE — Discharge Instructions (Signed)
Your evaluated for abdominal pain and diarrhea, on exam you do have some tenderness you are not drinking away or doubled over which is reassuring that there is most likely not an acutely inflamed organ, based on timeline of your symptoms it is possible that they are related to an inflammatory process of the intestines  Begin dicyclomine daily to help reduce stomach cramping  While low suspicion we will provide you with bacterial coverage to ensure that this is not contributing, take azithromycin as directed  For nausea you may use Zofran every 8 hours, placed under tongue and allow to dissolve  At upcoming PCP appointment please notify them of persisting abdominal pain and diarrhea so they may initiate further evaluation and workup, you have also been given information to the gastrointestinal doctor for further evaluation

## 2023-05-14 NOTE — ED Provider Notes (Signed)
Katherine Butler    CSN: [id number] Arrival date & time: 05/14/23  1812      History   Chief Complaint Chief Complaint  Patient presents with   Abdominal Pain    HPI Katherine Butler is a 22 y.o. female.   Patient presents for evaluation of persisting abdominal pain and diarrhea present for 1-1/2 months.  Was evaluated in this urgent care in November for same symptoms, at that time present for 1 day and thought to be GI stomach bug, was prescribed Bentyl and recommended supportive care, symptoms did resolve, has been taking Bentyl daily with last dosage 5 days ago, helped to resolve diarrhea and abdominal cramping however symptoms restarted 3 days ago, experiencing 2-3 soft bowel movements daily typically occurring a few minutes after consumption of meals.  Associated abdominal bloating and gas production.  Abdominal pain and cramping is noted to the lower abdomen occurring intermittently.  Has not attempted treatment further.  Denies dietary changes, recent travel or medication changes.  No prior history.  Has all organs.  Past Medical History:  Diagnosis Date   Asthma     Patient Active Problem List   Diagnosis Date Noted   Acetabular fracture (HCC) 10/27/2017   Closed left acetabular fracture (HCC) 10/27/2017   Hematuria 10/27/2017    Past Surgical History:  Procedure Laterality Date   ORIF ACETABULAR FRACTURE Left 10/28/2017   Procedure: OPEN REDUCTION INTERNAL FIXATION (ORIF) ACETABULAR FRACTURE;  Surgeon: Roby Lofts, MD;  Location: [facility] OR;  Service: Orthopedics;  Laterality: Left;    OB History   No obstetric history on file.      Home Medications    Prior to Admission medications   Medication Sig Start Date End Date Taking? Authorizing Provider  azithromycin (ZITHROMAX) 250 MG tablet Take 1 tablet (250 mg total) by mouth daily. Take first 2 tablets together, then 1 every day until finished. 05/14/23  Yes Denni France R, NP  ondansetron (ZOFRAN-ODT)  4 MG disintegrating tablet Take 1 tablet (4 mg total) by mouth every 8 (eight) hours as needed. 05/14/23  Yes Cipriana Biller R, NP  dicyclomine (BENTYL) 20 MG tablet Take 1 tablet (20 mg total) by mouth daily. 05/14/23   Valinda Hoar, NP  medroxyPROGESTERone (DEPO-PROVERA) 150 MG/ML injection ADM 1 ML IM 1 TIME Q 3 MONTHS 08/09/17   [provider]  metroNIDAZOLE (FLAGYL) 500 MG tablet Take 1 tablet (500 mg total) by mouth 2 (two) times daily. Patient not taking: Reported on 04/07/2023 11/06/22   Merrilee Jansky, MD  miconazole (MICOTIN) 2 % cream Apply 1 Application topically 2 (two) times daily. 12/11/22   Domenick Gong, MD    Family History History reviewed. No pertinent family history.  Social History Social History   Tobacco Use   Smoking status: Never   Smokeless tobacco: Never  Vaping Use   Vaping status: Never Used  Substance Use Topics   Alcohol use: No   Drug use: Never     Allergies   Patient has no known allergies.   Review of Systems Review of Systems   Physical Exam Triage Vital Signs ED Triage Vitals  Encounter Vitals Group     BP 05/14/23 1844 115/78     Systolic BP Percentile --      Diastolic BP Percentile --      Pulse Rate 05/14/23 1844 83     Resp 05/14/23 1844 18     Temp 05/14/23 1844 98.6 F (37 C)  Temp Source 05/14/23 1844 Oral     SpO2 05/14/23 1844 97 %     Weight --      Height --      Head Circumference --      Peak Flow --      Pain Score 05/14/23 1841 6     Pain Loc --      Pain Education --      Exclude from Growth Chart --    No data found.  Updated Vital Signs BP 115/78 (BP Location: Left Arm)   Pulse 83   Temp 98.6 F (37 C) (Oral)   Resp 18   SpO2 97%   Visual Acuity Right Eye Distance:   Left Eye Distance:   Bilateral Distance:    Right Eye Near:   Left Eye Near:    Bilateral Near:     Physical Exam Constitutional:      Appearance: Normal appearance. She is well-developed.  Eyes:      Extraocular Movements: Extraocular movements intact.  Pulmonary:     Effort: Pulmonary effort is normal.  Abdominal:     General: Abdomen is flat. Bowel sounds are normal.     Palpations: Abdomen is soft.     Tenderness: There is abdominal tenderness in the epigastric area.  Neurological:     Mental Status: She is alert and oriented to person, place, and time. Mental status is at baseline.      UC Treatments / Results  Labs (all labs ordered are listed, but only abnormal results are displayed) Labs Reviewed - No data to display  EKG   Radiology No results found.  Procedures Procedures (including critical care time)  Medications Ordered in UC Medications - No data to display  Initial Impression / Assessment and Plan / UC Course  I have reviewed the triage vital signs and the nursing notes.  Pertinent labs & imaging results that were available during my care of the patient were reviewed by me and considered in my medical decision making (see chart for details).  Epigastric abdominal pain, diarrhea  Vitals are stable, patient is in no signs of distress nontoxic-appearing, mild abdominal tenderness noted to the epigastric region otherwise unremarkable exam, stable for outpatient management, discussed all findings with patient, symptoms present for 1-1/2 months while low suspicion for bacterial cause will provide coverage with azithromycin, additionally refill Bentyl and prescribe Zofran as she endorses nausea without vomiting but has had episodes of gagging, has PCP appointment in the next 2 to 3 weeks to establish care, advised discussing symptoms at that time as well as given walker referral to GI Final Clinical Impressions(s) / UC Diagnoses   Final diagnoses:  Abdominal pain, epigastric  Diarrhea, unspecified type     Discharge Instructions      Your evaluated for abdominal pain and diarrhea, on exam you do have some tenderness you are not drinking away or doubled  over which is reassuring that there is most likely not an acutely inflamed organ, based on timeline of your symptoms it is possible that they are related to an inflammatory process of the intestines  Begin dicyclomine daily to help reduce stomach cramping  While low suspicion we will provide you with bacterial coverage to ensure that this is not contributing, take azithromycin as directed  For nausea you may use Zofran every 8 hours, placed under tongue and allow to dissolve  At upcoming PCP appointment please notify them of persisting abdominal pain and diarrhea so they may  initiate further evaluation and workup, you have also been given information to the gastrointestinal doctor for further evaluation   ED Prescriptions     Medication Sig Dispense Auth. Provider   dicyclomine (BENTYL) 20 MG tablet Take 1 tablet (20 mg total) by mouth daily. 30 tablet Tawnee Clegg R, NP   azithromycin (ZITHROMAX) 250 MG tablet Take 1 tablet (250 mg total) by mouth daily. Take first 2 tablets together, then 1 every day until finished. 6 tablet Sender Rueb R, NP   ondansetron (ZOFRAN-ODT) 4 MG disintegrating tablet Take 1 tablet (4 mg total) by mouth every 8 (eight) hours as needed. 20 tablet Valinda Hoar, NP      PDMP not reviewed this encounter.   Valinda Hoar, NP 05/14/23 [phone]

## 2023-05-26 DIAGNOSIS — Z113 Encounter for screening for infections with a predominantly sexual mode of transmission: Secondary | ICD-10-CM | POA: Diagnosis not present

## 2023-05-26 DIAGNOSIS — R87612 Low grade squamous intraepithelial lesion on cytologic smear of cervix (LGSIL): Secondary | ICD-10-CM | POA: Diagnosis not present

## 2023-05-26 DIAGNOSIS — Z01419 Encounter for gynecological examination (general) (routine) without abnormal findings: Secondary | ICD-10-CM | POA: Diagnosis not present

## 2023-05-26 DIAGNOSIS — Z3042 Encounter for surveillance of injectable contraceptive: Secondary | ICD-10-CM | POA: Diagnosis not present

## 2024-03-02 ENCOUNTER — Emergency Department

## 2024-03-02 ENCOUNTER — Other Ambulatory Visit: Payer: Self-pay

## 2024-03-02 ENCOUNTER — Emergency Department
Admission: EM | Admit: 2024-03-02 | Discharge: 2024-03-02 | Disposition: A | Discharge status: Home / Self Care | Attending: Emergency Medicine | Admitting: Emergency Medicine

## 2024-03-02 DIAGNOSIS — R0789 Other chest pain: Secondary | ICD-10-CM | POA: Insufficient documentation

## 2024-03-02 DIAGNOSIS — R6884 Jaw pain: Secondary | ICD-10-CM | POA: Insufficient documentation

## 2024-03-02 LAB — CBC
HCT: 40.8 % (ref 36.0–46.0)
Hemoglobin: 13.8 g/dL (ref 12.0–15.0)
MCH: 31.3 pg (ref 26.0–34.0)
MCHC: 33.8 g/dL (ref 30.0–36.0)
MCV: 92.5 fL (ref 80.0–100.0)
Platelets: 214 K/uL (ref 150–400)
RBC: 4.41 MIL/uL (ref 3.87–5.11)
RDW: 11.6 % (ref 11.5–15.5)
WBC: 7.9 K/uL (ref 4.0–10.5)
nRBC: 0 % (ref 0.0–0.2)

## 2024-03-02 LAB — TROPONIN I (HIGH SENSITIVITY)
Troponin I (High Sensitivity): <2 ng/L (ref <18)
Troponin I (High Sensitivity): <2 ng/L (ref <18)

## 2024-03-02 LAB — BASIC METABOLIC PANEL WITH GFR
Anion gap: 13 (ref 5–15)
BUN: 19 mg/dL (ref 6–20)
CO2: 21 mmol/L — ABNORMAL LOW (ref 22–32)
Calcium: 9.2 mg/dL (ref 8.9–10.3)
Chloride: 104 mmol/L (ref 98–111)
Creatinine, Ser: 0.98 mg/dL (ref 0.44–1.00)
GFR, Estimated: >60 mL/min (ref >60)
Glucose, Bld: 97 mg/dL (ref 70–99)
Potassium: 3.6 mmol/L (ref 3.5–5.1)
Sodium: 138 mmol/L (ref 135–145)

## 2024-03-02 LAB — RESP PANEL BY RT-PCR (RSV, FLU A&B, COVID)  RVPGX2
Influenza A by PCR: NEGATIVE
Influenza B by PCR: NEGATIVE
Resp Syncytial Virus by PCR: NEGATIVE
SARS Coronavirus 2 by RT PCR: NEGATIVE

## 2024-03-02 NOTE — ED Triage Notes (Signed)
 Pt reports chest pain for the past 2 weeks, pt states tonight her jaw began hurting and chills.

## 2024-03-02 NOTE — ED Provider Notes (Signed)
 [facility] Provider Note   Event Date/Time   First MD Initiated Contact with Patient 03/02/24 2259     (approximate) History  Chest Pain  HPI Katherine Butler is a 23 y.o. female with stated past surgical history of left acetabular ORIF who presents complaining of intermittent chest pain over the last 2 weeks.  Patient states that this pain is sharp, parasternal, and is worsened with movement especially when she is laying down in bed at the end of the night.  Patient states that she exercises daily and has had no exercise or activity out of the ordinary.  Patient denies any associated shortness of breath, nausea, lightheadedness, or vertigo with these pain spells.  Today patient states that she was researching her symptoms on [employer] and began having jaw pain as well as some hyperventilation.  Patient states that her pain is now a dull, 3/10, nonradiating central chest pain that is worse with palpation. ROS: Patient currently denies any vision changes, tinnitus, difficulty speaking, facial droop, sore throat, shortness of breath, abdominal pain, nausea/vomiting/diarrhea, dysuria, or weakness/numbness/paresthesias in any extremity   Physical Exam  Triage Vital Signs: ED Triage Vitals  Encounter Vitals Group     BP 03/02/24 1953 (!) 128/96     Girls Systolic BP Percentile --      Girls Diastolic BP Percentile --      Boys Systolic BP Percentile --      Boys Diastolic BP Percentile --      Pulse Rate 03/02/24 1953 85     Resp 03/02/24 1953 17     Temp 03/02/24 1953 98.1 F (36.7 C)     Temp src --      SpO2 03/02/24 1953 98 %     Weight 03/02/24 1952 179 lb (81.2 kg)     Height 03/02/24 1952 5' 4 (1.626 m)     Head Circumference --      Peak Flow --      Pain Score 03/02/24 1952 7     Pain Loc --      Pain Education --      Exclude from Growth Chart --    Most recent vital signs: Vitals:   03/02/24 1953 03/02/24 2259  BP: (!) 128/96 (!) 152/84  Pulse:  85 82  Resp: 17 19  Temp: 98.1 F (36.7 C)   SpO2: 98% 98%   General: Awake, oriented x4. CV:  Good peripheral perfusion.  No MGR Resp:  Normal effort.  CTAB Abd:  No distention. Other:  Young adult obese African-American female resting comfortably in no acute distress ED Results / Procedures / Treatments  Labs (all labs ordered are listed, but only abnormal results are displayed) Labs Reviewed  BASIC METABOLIC PANEL WITH GFR - Abnormal; Notable for the following components:      Result Value   CO2 21 (*)    All other components within normal limits  RESP PANEL BY RT-PCR (RSV, FLU A&B, COVID)  RVPGX2  CBC  POC URINE PREG, ED  TROPONIN I (HIGH SENSITIVITY)  TROPONIN I (HIGH SENSITIVITY)   EKG ED ECG REPORT I, Artist MARLA Kerns, the attending physician, personally viewed and interpreted this ECG. Date: 03/02/2024 EKG Time: 2306 Rate: 86 Rhythm: normal sinus rhythm QRS Axis: normal Intervals: normal ST/T Wave abnormalities: normal Narrative Interpretation: no evidence of acute ischemia RADIOLOGY ED MD interpretation: 2 view chest x-ray interpreted by me shows no evidence of acute abnormalities including no pneumonia, pneumothorax, or  widened mediastinum - All radiology independently interpreted and agree with radiology assessment Official radiology report(s): DG Chest 2 View Result Date: 03/02/2024 EXAM: 2 VIEW(S) XRAY OF THE CHEST 03/02/2024 08:14:30 PM COMPARISON: Portable chest 10/26/2017. CLINICAL HISTORY: cp. Pt reports chest pain for the past 2 weeks, pt states tonight her jaw began hurting and chills. FINDINGS: LUNGS AND PLEURA: No focal pulmonary opacity. No pulmonary edema. No pleural effusion. No pneumothorax. HEART AND MEDIASTINUM: No acute abnormality of the cardiac and mediastinal silhouettes. BONES AND SOFT TISSUES: Osseous structures are unremarkable apart from mild thoracic dextroscoliosis. Comparison to the prior study reveals no significant interval change.  IMPRESSION: 1. No acute cardiopulmonary findings. Stable chest. 2. Mild thoracic dextroscoliosis. Electronically signed by: Francis Quam MD 03/02/2024 08:19 PM EDT RP Workstation: [id number]   PROCEDURES: Critical Care performed: No Procedures MEDICATIONS ORDERED IN ED: Medications - No data to display IMPRESSION / MDM / ASSESSMENT AND PLAN / ED COURSE  I reviewed the triage vital signs and the nursing notes.                             The patient is on the cardiac monitor to evaluate for evidence of arrhythmia and/or significant heart rate changes. Patient's presentation is most consistent with acute presentation with potential threat to life or bodily function. Patient is a 23 year old female with the above-stated past medical history who presents complaining of intermittent chest pain over the last 2 weeks with associated jaw pain that began tonight. DDx: ACS, arrhythmia, pericarditis, PE, costochondritis Plan: CBC, BMP, troponin, EKG, chest x-ray Results: Workup shows no evidence of acute abnormalities with negative troponin x 1, negative respiratory viral panel, clear chest x-ray, and EKG showing no ischemic normal sinus rhythm.  At this point I have low suspicion for patient having PE or pericarditis given the intermittent nature of this pain without any associated tachycardia, hypoxia, shortness of breath.  I am concerned that patient's pain may be related to costochondritis however I cannot rectify the jaw pain that occurred tonight.  Given the negative workup here today, patient will be given referral to cardiology will.  Patient agrees to plan for discharge home today with PCP and cardiology follow-up.  Patient given strict return precautions and all questions answered prior to discharge   FINAL CLINICAL IMPRESSION(S) / ED DIAGNOSES   Final diagnoses:  Chest wall pain  Jaw pain   Rx / DC Orders   ED Discharge Orders          Ordered    Ambulatory referral to Cardiology        Comments: If you have not heard from the Cardiology office within the next 72 hours please call [phone].   03/02/24 2320           Note:  This document was prepared using Dragon voice recognition software and may include unintentional dictation errors.   Edwar Coe K, MD 03/02/24 2325

## 2024-03-02 NOTE — Discharge Instructions (Addendum)
 Please use ibuprofen (Motrin) up to 800 mg every 8 hours, naproxen (Naprosyn) up to 500 mg every 12 hours, and/or acetaminophen (Tylenol) up to 4 g/day for any continued pain.  Please do not use this medication regimen for longer than 7 days

## 2024-03-25 ENCOUNTER — Encounter: Payer: Self-pay | Admitting: Cardiology

## 2024-03-25 ENCOUNTER — Ambulatory Visit: Attending: Cardiology | Admitting: Cardiology

## 2024-03-25 VITALS — BP 90/56 | HR 77 | Ht 64.0 in | Wt 184.6 lb

## 2024-03-25 DIAGNOSIS — R079 Chest pain, unspecified: Secondary | ICD-10-CM

## 2024-03-25 NOTE — Patient Instructions (Signed)

## 2024-03-25 NOTE — Progress Notes (Signed)
 Cardiology Office Note:    Date:  03/25/2024   ID:  Katherine Butler, DOB 22-Oct-2000, MRN [id number]  PCP:  Laurice President, NP   [facility] Providers Cardiologist:  None     Referring MD: Jossie Artist POUR, MD   Chief Complaint  Patient presents with   New Patient (Initial Visit)    New patient / The pt has c/o chest pain, chest pressure x 4 months. No SOB, medication reviewed verbally with patient.    History of Present Illness:    Katherine Butler is a 23 y.o. female with no significant past medical history presenting with chest pain.  Endorses chest pain over the past 4 months, symptoms typically occur when patient is laying down.  Has noticed some improvement in his symptoms sometimes when she stretches her arms, symptoms get worse with crossing arms across chest.  Denies any personal or family history of heart disease.  She exercises almost daily by doing stairmaster exercises without symptoms of chest pain or shortness of breath.  Was evaluated in the ED about 3 weeks ago where workup with EKG and troponin were unrevealing.  Past Medical History:  Diagnosis Date   Asthma     Past Surgical History:  Procedure Laterality Date   ORIF ACETABULAR FRACTURE Left 10/28/2017   Procedure: OPEN REDUCTION INTERNAL FIXATION (ORIF) ACETABULAR FRACTURE;  Surgeon: Kendal Franky SQUIBB, MD;  Location: [facility] OR;  Service: Orthopedics;  Laterality: Left;    Current Medications: Current Meds  Medication Sig   medroxyPROGESTERone (DEPO-PROVERA) 150 MG/ML injection ADM 1 ML IM 1 TIME Q 3 MONTHS   ondansetron  (ZOFRAN -ODT) 4 MG disintegrating tablet Take 1 tablet (4 mg total) by mouth every 8 (eight) hours as needed.     Allergies:   Aspirin   Social History   Socioeconomic History   Marital status: Single    Spouse name: Not on file   Number of children: Not on file   Years of education: Not on file   Highest education level: Not on file  Occupational History   Not on file   Tobacco Use   Smoking status: Never   Smokeless tobacco: Never  Vaping Use   Vaping status: Never Used  Substance and Sexual Activity   Alcohol use: Yes    Comment: occ.   Drug use: Never   Sexual activity: Not on file  Other Topics Concern   Not on file  Social History Narrative   Not on file   Social Drivers of Health   Financial Resource Strain: Not on file  Food Insecurity: Not on file  Transportation Needs: Not on file  Physical Activity: Not on file  Stress: Not on file  Social Connections: Unknown (10/01/2021)   Received from [facility]   Social Network    Social Network: Not on file     Family History: The patient's family history is not on file.  ROS:   Please see the history of present illness.     All other systems reviewed and are negative.  EKGs/Labs/Other Studies Reviewed:    The following studies were reviewed today:  EKG Interpretation Date/Time:  Friday March 25 2024 08:56:38 EST Ventricular Rate:  77 PR Interval:  130 QRS Duration:  74 QT Interval:  368 QTC Calculation: 416 R Axis:   79  Text Interpretation: Normal sinus rhythm with sinus arrhythmia Normal ECG Confirmed by Darliss Rogue ([id number]) on 03/25/2024 9:15:10 AM    Recent Labs: 03/02/2024: BUN 19; Creatinine,  Ser 0.98; Hemoglobin 13.8; Platelets 214; Potassium 3.6; Sodium 138  Recent Lipid Panel No results found for: CHOL, TRIG, HDL, CHOLHDL, VLDL, LDLCALC, LDLDIRECT   Risk Assessment/Calculations:             Physical Exam:    VS:  BP (!) 90/56 (BP Location: Right Arm, Patient Position: Sitting, Cuff Size: Normal)   Pulse 77   Ht 5' 4 (1.626 m)   Wt 184 lb 9.6 oz (83.7 kg)   SpO2 97%   BMI 31.69 kg/m     Wt Readings from Last 3 Encounters:  03/25/24 184 lb 9.6 oz (83.7 kg)  03/02/24 179 lb (81.2 kg)  08/04/22 180 lb (81.6 kg)     GEN:  Well nourished, well developed in no acute distress HEENT: Normal NECK: No JVD; No carotid  bruits CARDIAC: RRR, no murmurs, rubs, gallops RESPIRATORY:  Clear to auscultation without rales, wheezing or rhonchi  ABDOMEN: Soft, non-tender, non-distended MUSCULOSKELETAL:  No edema; No deformity  SKIN: Warm and dry NEUROLOGIC:  Alert and oriented x 3 PSYCHIATRIC:  Normal affect   ASSESSMENT:    1. Chest pain of uncertain etiology    PLAN:    In order of problems listed above:  Chest pain, atypical symptoms worsening with crossing arms suggesting musculoskeletal etiology.  Exercises daily with no symptoms of chest pain or shortness of breath.  Patient is low risk from a cardiac perspective.  Noncardiac/musculoskeletal etiology likely.  Advised to follow-up with PCP for trial of NSAIDs.  Follow-up as needed      Medication Adjustments/Labs and Tests Ordered: Current medicines are reviewed at length with the patient today.  Concerns regarding medicines are outlined above.  Orders Placed This Encounter  Procedures   EKG 12-Lead   No orders of the defined types were placed in this encounter.   Patient Instructions  Medication Instructions:  Your physician recommends that you continue on your current medications as directed. Please refer to the Current Medication list given to you today.   *If you need a refill on your cardiac medications before your next appointment, please call your pharmacy*  Lab Work: No labs ordered today  If you have labs (blood work) drawn today and your tests are completely normal, you will receive your results only by: MyChart Message (if you have MyChart) OR A paper copy in the mail If you have any lab test that is abnormal or we need to change your treatment, we will call you to review the results.  Testing/Procedures: No test ordered today   Follow-Up: At [facility], you and your health needs are our priority.  As part of our continuing mission to provide you with exceptional heart care, our providers are all part of one team.   This team includes your primary Cardiologist (physician) and Advanced Practice Providers or APPs (Physician Assistants and Nurse Practitioners) who all work together to provide you with the care you need, when you need it.  Your next appointment:   As needed         Signed, Redell Cave, MD  03/25/2024 9:59 AM    [facility]
